# Patient Record
Sex: Female | Born: 1935 | Race: White | Hispanic: No | Smoking: Former smoker
Health system: Southern US, Community
[De-identification: ages and names within clinical notes are randomized; demographics above are authoritative.]

## PROBLEM LIST (undated history)

## (undated) DIAGNOSIS — C679 Malignant neoplasm of bladder, unspecified: Secondary | ICD-10-CM

## (undated) DIAGNOSIS — L89159 Pressure ulcer of sacral region, unspecified stage: Secondary | ICD-10-CM

## (undated) DIAGNOSIS — I10 Essential (primary) hypertension: Secondary | ICD-10-CM

## (undated) DIAGNOSIS — I714 Abdominal aortic aneurysm, without rupture: Secondary | ICD-10-CM

## (undated) DIAGNOSIS — N939 Abnormal uterine and vaginal bleeding, unspecified: Secondary | ICD-10-CM

## (undated) DIAGNOSIS — Z720 Tobacco use: Secondary | ICD-10-CM

## (undated) DIAGNOSIS — I639 Cerebral infarction, unspecified: Secondary | ICD-10-CM

## (undated) DIAGNOSIS — G459 Transient cerebral ischemic attack, unspecified: Secondary | ICD-10-CM

## (undated) DIAGNOSIS — M51369 Other intervertebral disc degeneration, lumbar region without mention of lumbar back pain or lower extremity pain: Secondary | ICD-10-CM

## (undated) DIAGNOSIS — R918 Other nonspecific abnormal finding of lung field: Secondary | ICD-10-CM

## (undated) DIAGNOSIS — H9190 Unspecified hearing loss, unspecified ear: Secondary | ICD-10-CM

## (undated) DIAGNOSIS — I739 Peripheral vascular disease, unspecified: Secondary | ICD-10-CM

## (undated) DIAGNOSIS — F0390 Unspecified dementia without behavioral disturbance: Secondary | ICD-10-CM

## (undated) DIAGNOSIS — T4145XA Adverse effect of unspecified anesthetic, initial encounter: Secondary | ICD-10-CM

## (undated) DIAGNOSIS — C7989 Secondary malignant neoplasm of other specified sites: Secondary | ICD-10-CM

## (undated) DIAGNOSIS — H409 Unspecified glaucoma: Secondary | ICD-10-CM

## (undated) DIAGNOSIS — J302 Other seasonal allergic rhinitis: Secondary | ICD-10-CM

## (undated) DIAGNOSIS — H3530 Unspecified macular degeneration: Secondary | ICD-10-CM

## (undated) DIAGNOSIS — E039 Hypothyroidism, unspecified: Secondary | ICD-10-CM

## (undated) DIAGNOSIS — I499 Cardiac arrhythmia, unspecified: Secondary | ICD-10-CM

## (undated) DIAGNOSIS — E079 Disorder of thyroid, unspecified: Secondary | ICD-10-CM

## (undated) DIAGNOSIS — J449 Chronic obstructive pulmonary disease, unspecified: Secondary | ICD-10-CM

## (undated) DIAGNOSIS — M5136 Other intervertebral disc degeneration, lumbar region: Secondary | ICD-10-CM

## (undated) DIAGNOSIS — G629 Polyneuropathy, unspecified: Secondary | ICD-10-CM

## (undated) DIAGNOSIS — Z9221 Personal history of antineoplastic chemotherapy: Secondary | ICD-10-CM

## (undated) DIAGNOSIS — I2510 Atherosclerotic heart disease of native coronary artery without angina pectoris: Secondary | ICD-10-CM

## (undated) HISTORY — DX: Other intervertebral disc degeneration, lumbar region: M51.36

## (undated) HISTORY — DX: Unspecified hearing loss, unspecified ear: H91.90

## (undated) HISTORY — PX: BLADDER SURGERY: SHX569

## (undated) HISTORY — DX: Unspecified glaucoma: H40.9

## (undated) HISTORY — DX: Atherosclerotic heart disease of native coronary artery without angina pectoris: I25.10

## (undated) HISTORY — DX: Abdominal aortic aneurysm, without rupture: I71.4

## (undated) HISTORY — PX: APPENDECTOMY: SHX54

## (undated) HISTORY — DX: Cerebral infarction, unspecified: I63.9

## (undated) HISTORY — DX: Transient cerebral ischemic attack, unspecified: G45.9

## (undated) HISTORY — DX: Other nonspecific abnormal finding of lung field: R91.8

## (undated) HISTORY — DX: Abnormal uterine and vaginal bleeding, unspecified: N93.9

## (undated) HISTORY — DX: Polyneuropathy, unspecified: G62.9

## (undated) HISTORY — DX: Secondary malignant neoplasm of other specified sites: C67.9

## (undated) HISTORY — DX: Essential (primary) hypertension: I10

## (undated) HISTORY — PX: BACK SURGERY: SHX140

## (undated) HISTORY — PX: URETERAL STENT PLACEMENT: SHX822

## (undated) HISTORY — DX: Unspecified macular degeneration: H35.30

## (undated) HISTORY — DX: Personal history of antineoplastic chemotherapy: Z92.21

## (undated) HISTORY — DX: Other seasonal allergic rhinitis: J30.2

## (undated) HISTORY — DX: Unspecified dementia, unspecified severity, without behavioral disturbance, psychotic disturbance, mood disturbance, and anxiety: F03.90

## (undated) HISTORY — DX: Pressure ulcer of sacral region, unspecified stage: L89.159

## (undated) HISTORY — DX: Tobacco use: Z72.0

## (undated) HISTORY — PX: CATARACT EXTRACTION: SUR2

## (undated) HISTORY — DX: Cardiac arrhythmia, unspecified: I49.9

## (undated) HISTORY — PX: TONSILLECTOMY: SUR1361

## (undated) HISTORY — DX: Hypothyroidism, unspecified: E03.9

## (undated) HISTORY — PX: ABDOMINAL HYSTERECTOMY: SHX81

## (undated) HISTORY — DX: Peripheral vascular disease, unspecified: I73.9

## (undated) HISTORY — DX: Other intervertebral disc degeneration, lumbar region without mention of lumbar back pain or lower extremity pain: M51.369

## (undated) HISTORY — PX: SKIN BIOPSY: SHX1

## (undated) HISTORY — PX: COLONOSCOPY: SHX174

## (undated) HISTORY — PX: OTHER SURGICAL HISTORY: SHX169

## (undated) HISTORY — DX: Adverse effect of unspecified anesthetic, initial encounter: T41.45XA

## (undated) HISTORY — DX: Chronic obstructive pulmonary disease, unspecified: J44.9

## (undated) HISTORY — DX: Disorder of thyroid, unspecified: E07.9

## (undated) HISTORY — DX: Malignant neoplasm of bladder, unspecified: C67.9

## (undated) HISTORY — DX: Secondary malignant neoplasm of other specified sites: C79.89

---
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
## 2007-03-04 ENCOUNTER — Ambulatory Visit (HOSPITAL_COMMUNITY): Admission: RE | Admit: 2007-03-04 | Discharge: 2007-03-05 | Payer: Self-pay | Admitting: Cardiology

## 2007-03-04 DIAGNOSIS — I714 Abdominal aortic aneurysm, without rupture, unspecified: Secondary | ICD-10-CM

## 2007-03-04 DIAGNOSIS — I2510 Atherosclerotic heart disease of native coronary artery without angina pectoris: Secondary | ICD-10-CM

## 2007-03-04 HISTORY — DX: Abdominal aortic aneurysm, without rupture, unspecified: I71.40

## 2007-03-04 HISTORY — PX: CARDIAC CATHETERIZATION: SHX172

## 2007-03-04 HISTORY — DX: Atherosclerotic heart disease of native coronary artery without angina pectoris: I25.10

## 2007-03-04 HISTORY — DX: Abdominal aortic aneurysm, without rupture: I71.4

## 2011-02-27 NOTE — Cardiovascular Report (Signed)
NAMEHARLIE, BUENING NO.:  [id number]   MEDICAL RECORD NO.:  [id number]          PATIENT TYPE:  OIB   LOCATION:  2807                         FACILITY:  [facility]   PHYSICIAN:  Thereasa Solo. Little, M.D. DATE OF BIRTH:  10/24/1925   DATE OF PROCEDURE:  03/04/2007  DATE OF DISCHARGE:                            CARDIAC CATHETERIZATION   INDICATIONS FOR TEST:  This 77 year old female with hypertension and  hyperlipidemia and COPD was referred by Dr. Shelva Majestic for cardiac  catheterization.  She has had increasing shortness of breath with mild  activity.  To [place], she denies any episodes of chest pain or pressure.  She does have occasional palpitations.   After obtaining informed consent, the patient was prepped and draped in  the usual sterile manner __________  5-French introducer sheath was  placed into the right femoral artery.  Left and right coronary  arteriography, ventriculography and a distal aortogram were performed.   COMPLICATIONS:  None.   EQUIPMENT:  5-French Judkins configuration catheter.   TOTAL CONTRAST USED:  100 mL.   MEDICATIONS:  20 mg of IV labetalol was given at the end of procedure  for blood pressure lowering.   RESULTS:  1. Hemodynamic monitoring:  Central aortic pressure was 194/75.  Left      ventricular pressure was 190/12.  2. Ventriculography.  Ventriculography in the RAO projection using 20      mL of contrast at 12 mL per second revealed mild left ventricular      hypertrophy with mild global hypokinesis and ejection fraction      around 45-50%.  The left ventricular pressure was 19.   Distal aortogram.  The distal aortogram done at the level of the renal  arteries using 30 mL of contrasted at 15 mL per second revealed no  evidence of any renal artery stenosis.  There is a small fusiform  infrarenal abdominal aortic aneurysm that extends down to the  bifurcation of the aorta.   Coronary arteriography:  Calcification was seen in the  distribution of  the proximal LAD.  1. Left main normal.  It bifurcated.  2. Circumflex.  The circumflex had minimal irregularities in the      proximal portion.  There was a small first OM vessel that was free      of disease and a large second OM vessel that had a few branches      coming off that was also free of disease.  3. LAD.  The LAD extended down across the apex of the heart.  There      was minimal irregularities within the LAD itself.  This was a very      large vessel about 3.5 mm in diameter.  There was a moderate 2.5-mm      first diagonal.  There was an 80% area of ostial narrowing of this      diagonal.  4. Right coronary artery.  The right coronary was a smaller vessel      than the other two.  There was mild 30-40% narrowing in both the  mid and distal portion of the vessel.  The PDA and posterior      lateral vessels were free of disease.   CONCLUSION:  1. No evidence of renal artery stenosis.  2. Small fusiform infrarenal abdominal aortic aneurysm.  3. Left ventricular hypertrophy.  4. Mild global hypokinesia with ejection fraction of around 45-50%.  5. High-grade stenosis in the ostium of the first diagonal with mild      disease in the other vessels.   DISCUSSION:  Attempts at intervening upon the diagonal because of its  ostial location would be considered high risk.  My short term plan is  aggressive management of her blood pressure.  Unfortunately, she has had  problems with noncompliance in the past.  I switched her Micardis to  Exforge 5/320 and left her on Bystolic 5 mg.  In addition to this, she  is on aspirin and has been on statin-lowering drugs in the past.  I will  talk with Arnette Felts about restarting these.   If she were to develop worsening chest discomfort, would consider  cutting balloon to the ostium, but this would clearly be high risk.  A  lot of her symptoms may be related to her hypertension.            ______________________________  Thereasa Solo Little, M.D.     ABL/MEDQ  D:  03/04/2007  T:  03/04/2007  Job:  [id number]   cc:   Macarthur Critchley. Shelva Majestic, M.D.  Roxanne Mins, PA-C  Catheterization Laboratory  Thereasa Solo. Little, M.D.

## 2011-02-27 NOTE — Discharge Summary (Signed)
Annette Annette [name], Annette [name] NO.:  [id number]   MEDICAL RECORD NO.:  [id number]          PATIENT TYPE:  OIB   LOCATION:  3729                         FACILITY:  [facility]   PHYSICIAN:  Vonna Kotyk R. Jacinto Halim, MD       DATE OF BIRTH:  1926-06-23   DATE OF ADMISSION:  03/04/2007  DATE OF DISCHARGE:  03/05/2007                               DISCHARGE SUMMARY   DISCHARGE DIAGNOSES:  1. Coronary artery disease, status post catheterization on date of      admission by Dr. Clarene Duke - recommendations to continue aggressive      medical therapy.  2. Hypertension.  3. Hyperlipidemia.  4. Chronic obstructive pulmonary disease.  5. Hypothyroidism.  6. Ongoing tobacco use.  7. Abnormal liver enzymes.  8. ALLERGY TO THE IV CONTRAST MEDIA.  9. Urinary tract infection.  10.Medical noncompliance.   HPI/HOSPITAL COURSE:  This is a 77 year old Caucasian female, patient of  Dr. Shelva Majestic in [facility], who presented to [facility] for  coronary angiography to further evaluate her complaints of dyspnea and  chest pain.   Mainly, patient had been complaining of exertional dyspnea, which was  relieved with 10 or 15 minutes of rest and not much chest pain, just  occasional tightness and the dyspnea was progressive.  She also  complained of palpitations with lying flat, but no orthopnea or PND.   HOSPITAL PROCEDURES:  Cardiac catheterization performed by Dr. Clarene Duke on  Mar 04, 2007 and it showed 80% narrowing of the ostium of the first  diagonal branch.  Otherwise, LAD was free of disease.  Circumflex was  free of disease.  RCA was a small vessel that had 30-40% narrowing in  the mid and distal portions.  PDA and PLA were free of disease.  There  was no evidence of renal artery stenosis.  Small infrarenal abdominal  aortic aneurysm was noted.  Ejection fraction was 45-50% and also there  was left ventricular hypertrophy noted during the study.  The heart rate  did not reveal the ostium of first  diagonal branch.  Would consider to  risk it to intervene and the decision was made to continue aggressive  medical therapy, mainly to control patient's blood pressure and to add  beta-blockers to her medical regimen.   HOSPITAL LABORATORIES:  Liver enzymes were normal during this admission.  AST 24, ALT 18.  Creatinine 1.10 prior to cath and BUN 14.  Postcath,  creatinine was 1.13 and BUN 19, sodium 135, potassium 3.7, chloride 105,  CO2 25, glucose 135.  CBC showed hemoglobin 13.4, hematocrit 39.1, white  blood cell count 15.7, platelet count 221.  Her urinalysis, during this  admission, revealed many white blood cells and squamous epithelial cells  and some bacteria, so she was given Septra prophylactically prior to  cath.   The next day, postcatheterization, patient was considered to be stable  for discharge after she ambulates in the ward.   Patient ambulated without any difficulties.  She did not complain of any  chest pain and did not have any dyspnea on exertion.  We are going to  discharge patient home on the following medications:  1. Exforge 5/320 mg daily.  2. Aspirin 81 mg daily.  3. Bystolic 5 mg daily.  4. Imdur 30 mg daily.  5. Toprol-XL 25 mg daily.  6. Armour Thyroid 60 mg q.a.m. and 30 mg q.p.m.  7. Lipitor 40 mg daily.   DISCHARGE INSTRUCTIONS:  Patient is to stop Micardis and Benicar.  She  is not to drive or lift any weight greater than 5 pounds for 3 days  postcath.  She was advised to increase activity slowly and she is  allowed to take a shower, but no bathing.  Patient is to stay on a low-  fat, low-cholesterol diet.   DISCHARGE ACTIVITIES:  Patient is to see Dr. Shelva Majestic in 2-3 weeks and  she was encouraged to call his office and make an appointment.      Raymon Mutton, P.A.      [name], MD  Electronically Signed    MK/MEDQ  D:  03/05/2007  T:  03/05/2007  Job:  [id number]   cc:   Thereasa Solo. Little, M.D.  Macarthur Critchley Shelva Majestic, M.D.

## 2013-07-15 DIAGNOSIS — T8859XA Other complications of anesthesia, initial encounter: Secondary | ICD-10-CM

## 2013-07-15 HISTORY — DX: Other complications of anesthesia, initial encounter: T88.59XA

## 2013-10-01 DIAGNOSIS — R918 Other nonspecific abnormal finding of lung field: Secondary | ICD-10-CM | POA: Insufficient documentation

## 2013-10-01 HISTORY — DX: Other nonspecific abnormal finding of lung field: R91.8

## 2013-10-12 DIAGNOSIS — E039 Hypothyroidism, unspecified: Secondary | ICD-10-CM

## 2013-10-12 HISTORY — DX: Hypothyroidism, unspecified: E03.9

## 2013-11-06 HISTORY — PX: CYSTOSCOPY WITH RETROGRADE PYELOGRAM, URETEROSCOPY AND STENT PLACEMENT: SHX5789

## 2013-11-06 HISTORY — PX: VAGINAL MASS EXCISION: SHX2640

## 2013-11-24 DIAGNOSIS — F17200 Nicotine dependence, unspecified, uncomplicated: Secondary | ICD-10-CM | POA: Insufficient documentation

## 2014-03-18 ENCOUNTER — Ambulatory Visit: Payer: PRIVATE HEALTH INSURANCE | Attending: Pediatrics | Admitting: Occupational Therapy

## 2014-03-18 DIAGNOSIS — H402290 Chronic angle-closure glaucoma, unspecified eye, stage unspecified: Secondary | ICD-10-CM | POA: Diagnosis not present

## 2014-03-18 DIAGNOSIS — H3530 Unspecified macular degeneration: Secondary | ICD-10-CM | POA: Diagnosis not present

## 2014-03-18 DIAGNOSIS — H409 Unspecified glaucoma: Secondary | ICD-10-CM | POA: Diagnosis not present

## 2014-03-18 DIAGNOSIS — H5410 Blindness, one eye, low vision other eye, unspecified eyes: Secondary | ICD-10-CM | POA: Insufficient documentation

## 2014-03-18 DIAGNOSIS — IMO0001 Reserved for inherently not codable concepts without codable children: Secondary | ICD-10-CM | POA: Diagnosis present

## 2014-03-18 DIAGNOSIS — H53419 Scotoma involving central area, unspecified eye: Secondary | ICD-10-CM | POA: Insufficient documentation

## 2014-04-23 ENCOUNTER — Non-Acute Institutional Stay (SKILLED_NURSING_FACILITY): Payer: Medicare Other | Admitting: Internal Medicine

## 2014-04-23 DIAGNOSIS — I10 Essential (primary) hypertension: Secondary | ICD-10-CM

## 2014-04-23 DIAGNOSIS — C7989 Secondary malignant neoplasm of other specified sites: Secondary | ICD-10-CM

## 2014-04-23 DIAGNOSIS — F17210 Nicotine dependence, cigarettes, uncomplicated: Secondary | ICD-10-CM

## 2014-04-23 DIAGNOSIS — C679 Malignant neoplasm of bladder, unspecified: Secondary | ICD-10-CM

## 2014-04-23 DIAGNOSIS — F17200 Nicotine dependence, unspecified, uncomplicated: Secondary | ICD-10-CM

## 2014-04-23 DIAGNOSIS — I160 Hypertensive urgency: Secondary | ICD-10-CM

## 2014-04-23 DIAGNOSIS — C779 Secondary and unspecified malignant neoplasm of lymph node, unspecified: Secondary | ICD-10-CM

## 2014-04-23 DIAGNOSIS — G9341 Metabolic encephalopathy: Secondary | ICD-10-CM

## 2014-04-23 DIAGNOSIS — C50919 Malignant neoplasm of unspecified site of unspecified female breast: Secondary | ICD-10-CM

## 2014-04-24 ENCOUNTER — Encounter: Payer: Self-pay | Admitting: Internal Medicine

## 2014-04-24 DIAGNOSIS — M5136 Other intervertebral disc degeneration, lumbar region: Secondary | ICD-10-CM | POA: Insufficient documentation

## 2014-04-24 DIAGNOSIS — J449 Chronic obstructive pulmonary disease, unspecified: Secondary | ICD-10-CM | POA: Insufficient documentation

## 2014-04-24 DIAGNOSIS — I714 Abdominal aortic aneurysm, without rupture, unspecified: Secondary | ICD-10-CM | POA: Insufficient documentation

## 2014-04-24 DIAGNOSIS — G9341 Metabolic encephalopathy: Secondary | ICD-10-CM | POA: Insufficient documentation

## 2014-04-24 DIAGNOSIS — E039 Hypothyroidism, unspecified: Secondary | ICD-10-CM | POA: Insufficient documentation

## 2014-04-24 DIAGNOSIS — I160 Hypertensive urgency: Secondary | ICD-10-CM | POA: Insufficient documentation

## 2014-04-24 DIAGNOSIS — F0390 Unspecified dementia without behavioral disturbance: Secondary | ICD-10-CM | POA: Insufficient documentation

## 2014-04-24 DIAGNOSIS — I10 Essential (primary) hypertension: Secondary | ICD-10-CM | POA: Insufficient documentation

## 2014-04-24 DIAGNOSIS — C679 Malignant neoplasm of bladder, unspecified: Secondary | ICD-10-CM | POA: Insufficient documentation

## 2014-04-24 DIAGNOSIS — C7989 Secondary malignant neoplasm of other specified sites: Secondary | ICD-10-CM | POA: Insufficient documentation

## 2014-04-24 NOTE — Assessment & Plan Note (Signed)
Treatment with BCG instillation and stent to R ureter 2/2 obstruction which was to e removed when pt's BP shot up and required hospitalization

## 2014-04-24 NOTE — Assessment & Plan Note (Signed)
Presenting with MS change;family reported concern for her being hypotensive causing HP MDs to wonder if hx HTN but per [facility] pt with HTN in at least 10/2013;treated and resolved

## 2014-04-24 NOTE — Assessment & Plan Note (Signed)
Present

## 2014-04-24 NOTE — Progress Notes (Signed)
MRN: [id number] Name: Annette [name]  Sex: female Age: 80 y.o. DOB: [birth year]  [place] #: [facility] Facility/Room: 112 Level Of Care: SNF Provider: Inocencio Homes D Emergency Contacts: Extended Emergency Contact Information Primary Emergency Contact: Reeves Phone: [phone] Relation: None  Code Status: FULL - needs to be DNR as she would be unlikely to survive CPR without severe complications  Allergies: Beta adrenergic blockers; Ciprofloxacin; Combigan; Iodine; Sulfa antibiotics; and Tetracyclines & related  Chief Complaint  Patient presents with  . nursing home admission    HPI: Patient is 80 y.o. female who was hospitalized for acute encephalopathy and HTN urgency who comes from home, then hosp then SNF with bladder CA with obstruction and stent.  Past Medical History  Diagnosis Date  . Dementia without behavioral disturbance   . Bladder cancer metastasized to pelvic region   . COPD (chronic obstructive pulmonary disease)   . Hypothyroid   . Hypertension     Past Surgical History  Procedure Laterality Date  . Ureteral stent placement Right     2/2 bladder CA  . Vaginal tumor      removed in remote past  . Eye surgery      cataract      Medication List       This list is accurate as of: 04/23/14 11:59 PM.  Always use your most recent med list.               amLODipine 10 MG tablet  Commonly known as:  NORVASC  Take 10 mg by mouth daily.     bimatoprost 0.01 % Soln  Commonly known as:  LUMIGAN  Place 1 drop into both eyes at bedtime.     budesonide 180 MCG/ACT inhaler  Commonly known as:  PULMICORT  Inhale 1 puff into the lungs 2 (two) times daily.     donepezil 5 MG tablet  Commonly known as:  ARICEPT  Take 5 mg by mouth at bedtime.     levothyroxine 25 MCG tablet  Commonly known as:  SYNTHROID, LEVOTHROID  Take 25 mcg by mouth daily before breakfast.     lisinopril 20 MG tablet  Commonly known as:  PRINIVIL,ZESTRIL  Take 20 mg by  mouth daily.     MULTIVITAMIN & MINERAL PO  Take 1 tablet by mouth daily.     psyllium 58.6 % powder  Commonly known as:  METAMUCIL  Take 1 packet by mouth daily as needed.        Meds ordered this encounter  Medications  . bimatoprost (LUMIGAN) 0.01 % SOLN    Sig: Place 1 drop into both eyes at bedtime.  . budesonide (PULMICORT) 180 MCG/ACT inhaler    Sig: Inhale 1 puff into the lungs 2 (two) times daily.  . Multiple Vitamins-Minerals (MULTIVITAMIN & MINERAL PO)    Sig: Take 1 tablet by mouth daily.  . psyllium (METAMUCIL) 58.6 % powder    Sig: Take 1 packet by mouth daily as needed.  Marland Kitchen amLODipine (NORVASC) 10 MG tablet    Sig: Take 10 mg by mouth daily.  Marland Kitchen donepezil (ARICEPT) 5 MG tablet    Sig: Take 5 mg by mouth at bedtime.  Marland Kitchen levothyroxine (SYNTHROID, LEVOTHROID) 25 MCG tablet    Sig: Take 25 mcg by mouth daily before breakfast.  . lisinopril (PRINIVIL,ZESTRIL) 20 MG tablet    Sig: Take 20 mg by mouth daily.     There is no immunization history on file for this patient.  History  Substance Use Topics  . Smoking status: Current Every Day Smoker  . Smokeless tobacco: Never Used  . Alcohol Use: No    Family history is noncontributory    Review of Systems   Family member gives basically neg ROS   Filed Vitals:   04/23/14 1142  BP: 117/65  Pulse: 67  Temp: 97.9 F (36.6 C)  Resp: 18    Physical Exam  GENERAL APPEARANCE: Alert,min conversant. Appropriately groomed. No acute distress.;extremely frail  SKIN: No diaphoresis rash HEAD: Normocephalic, atraumatic  EYES: Conjunctiva/lids clear. Pupils round, reactive. EOMs intact.  EARS: External exam WNL, canals clear. Hearing grossly normal.  NOSE: No deformity or discharge.  MOUTH/THROAT: Lips w/o lesions. RESPIRATORY: Breathing is even, unlabored. Lung sounds are clear   CARDIOVASCULAR: Heart RRR 3/6 murmur rubs or gallops. No peripheral edema.   GASTROINTESTINAL: Abdomen is soft, non-tender, not  distended w/ normal bowel sounds. GENITOURINARY: Bladder non tender, not distended  MUSCULOSKELETAL: No abnormal joints or musculature NEUROLOGIC:  Cranial nerves 2-12 grossly intact. Moves all extremities PSYCHIATRIC: dementia, no behavioral issues  Patient Active Problem List   Diagnosis Date Noted  . AAA (abdominal aortic aneurysm) 04/24/2014  . DDD (degenerative disc disease), lumbar 04/24/2014  . Encephalopathy, metabolic 75/44/9201  . Hypertensive urgency 04/24/2014  . Dementia without behavioral disturbance   . Bladder cancer metastasized to pelvic region   . COPD (chronic obstructive pulmonary disease)   . Hypothyroid   . Hypertension   . Nicotine addiction 11/24/2013  . Lung nodule, multiple 10/01/2013    CBC   WBC 8.6  13.4/   PLT 157   CMP Na 137, 3.7  21/0.75 , Ca 9.7, alb 3.4   Assessment and Plan  Encephalopathy, metabolic Cause unknown, possibly hypertensive uegency  Hypertensive urgency Presenting with MS change;family reported concern for her being hypotensive causing HP MDs to wonder if hx HTN but per [facility] pt with HTN in at least 10/2013;treated and resolved  Bladder cancer metastasized to pelvic region Treatment with BCG instillation and stent to R ureter 2/2 obstruction which was to e removed when pt's BP shot up and required hospitalization  Nicotine addiction Present    Hennie Duos, MD

## 2014-04-24 NOTE — Assessment & Plan Note (Signed)
Cause unknown, possibly hypertensive uegency

## 2014-04-26 ENCOUNTER — Non-Acute Institutional Stay (SKILLED_NURSING_FACILITY): Payer: Medicare Other | Admitting: Internal Medicine

## 2014-04-26 ENCOUNTER — Encounter: Payer: Self-pay | Admitting: Internal Medicine

## 2014-04-26 DIAGNOSIS — I10 Essential (primary) hypertension: Secondary | ICD-10-CM

## 2014-04-26 DIAGNOSIS — R609 Edema, unspecified: Secondary | ICD-10-CM

## 2014-04-26 NOTE — Progress Notes (Signed)
Patient ID: Annette [name], female   DOB: May 06, 1926, 80 y.o.   MRN: [id number]   this is an acute visit.  Level of care skilled.  Facility AF.   Chief Complaint   Patient presents with   .   acute visit secondary to lower extremity edema   HPI: Patient is 80 y.o. female who was hospitalized for acute encephalopathy and HTN urgency who comes from home, then hosp then SNF with bladder CA with obstruction and stent--.  Her stay here so far as been relatively uneventful however nursing staff has noted some increased lower extremity edema--mainly in her feet.  She does have a history of hypertensive urgency-she is on Norvasc 10 mg a day as well as lisinopril 20 mg a day-she does not report any shortness of breath chest pain or leg pain.   Past Medical History   Diagnosis  Date   .  Dementia without behavioral disturbance    .  Bladder cancer metastasized to pelvic region    .  COPD (chronic obstructive pulmonary disease)    .  Hypothyroid    .  Hypertension     Past Surgical History   Procedure  Laterality  Date   .  Ureteral stent placement  Right      2/2 bladder CA   .  Vaginal tumor       removed in remote past   .  Eye surgery       cataract      Medication List                     amLODipine 10 MG tablet    Commonly known as: NORVASC    Take 10 mg by mouth daily.    bimatoprost 0.01 % Soln    Commonly known as: LUMIGAN    Place 1 drop into both eyes at bedtime.    budesonide 180 MCG/ACT inhaler    Commonly known as: PULMICORT    Inhale 1 puff into the lungs 2 (two) times daily.    donepezil 5 MG tablet    Commonly known as: ARICEPT    Take 5 mg by mouth at bedtime.    levothyroxine 25 MCG tablet    Commonly known as: SYNTHROID, LEVOTHROID    Take 25 mcg by mouth daily before breakfast.    lisinopril 20 MG tablet    Commonly known as: PRINIVIL,ZESTRIL    Take 20 mg by mouth daily.    MULTIVITAMIN & MINERAL PO    Take 1 tablet by mouth daily.    psyllium  58.6 % powder    Commonly known as: METAMUCIL    Take 1 packet by mouth daily as needed.                                                                   There is no immunization history on file for this patient.  History   Substance Use Topics   .  Smoking status:  Current Every Day Smoker   .  Smokeless tobacco:  Never Used   .  Alcohol Use:  No   Family history is noncontributory  Review of Systems    Gen. no complaints of fever or chills.  Respiratory  does not complain of shortness of breath or cough.  Cardiac no chest pain does have some edema mainly in her feet bilaterally.  GI-no complaint of abdominal pain nausea vomiting diarrhea or constipation  Muscle skeletal does not complaining of joint pain or leg pain.  Neurologic is not complaining of dizziness or headache    Physical Exam  Temperature 98.7 pulse 58 respirations 20 blood pressure 114/60-130/62-most recently  GENERAL APPEARANCE: Alert,min conversant. Appropriately groomed. No acute distress.;extremely frail  SKIN: No diaphoresis rash has some skin tears on her left upper arm--slight abrasions right elbow and left shin HEAD: Normocephalic, atraumatic  EYES: Conjunctiva/lids clear. Pupils round, reactive. EOMs intact.  l.  MOUTH/THROAT: Oropharynx clear mucous membranes moist  RESPIRATORY: Breathing is even, unlabored. Lung sounds are clear  CARDIOVASCULAR: Heart RRR 3/6 murmur rubs or gallops. 1+ edema of her feet  bilat mainly trace edema of her legs.  GASTROINTESTINAL: Abdomen is soft, non-tender, not distended w/ normal bowel sounds.   MUSCULOSKELETAL: No abnormal joints or musculature moves all extremities x4 NEUROLOGIC: Cranial nerves 2-12 grossly intact. Moves all extremities  PSYCHIATRIC: dementia, no behavioral issues  Patient Active Problem List    Diagnosis  Date Noted   .  AAA (abdominal aortic aneurysm)  04/24/2014   .  DDD (degenerative disc disease), lumbar  04/24/2014     .  Encephalopathy, metabolic  41/66/0630   .  Hypertensive urgency  04/24/2014   .  Dementia without behavioral disturbance    .  Bladder cancer metastasized to pelvic region    .  COPD (chronic obstructive pulmonary disease)    .  Hypothyroid    .  Hypertension    .  Nicotine addiction  11/24/2013   .  Lung nodule, multiple  10/01/2013   CBC WBC 8.6 13.4/ PLT 157  CMP Na 137, 3.7 21/0.75 , Ca 9.7, alb 3.4   Hospital labs.  WBC 12.2 hemoglobin 12.7 platelets 151.  Sodium 138 potassium 3.6 BUN 21 creatinine 0.84-liver function tests within normal limits except total bilirubin of 0.7-TSH at 0.133-T4 0.79--T3-53.0.   Assessment and Plan   Edema-clinically she appears stable will write order to weigh patient will notify provider gain greater than 3 pounds-also encouraged leg elevation-update a BNP tomorrow notify provider--also weights  3 times a week Encephalopathy, metabolic  Cause unknown, possibly hypertensive urgency but this has resolved  Hypertensive urgency  Presenting with MS change;family reported concern for her being hypotensive causing HP MDs to wonder if hx HTN but per [facility] pt with HTN in at least 10/2013;treated and resolved--recent blood pressures appear satisfactory 130/62-114/60-126/65  Bladder cancer metastasized to pelvic region  Treatment with BCG instillation and stent to R ureter 2/2 obstruction which was to e removed when pt's BP shot up and required hospitalization  Of note also will update CBC basic metabolic panel and TSH tomorrow-I do not somewhat low TSH T4 and T3 previously  [id number]  Nicotine addiction  Present

## 2014-05-02 DIAGNOSIS — I10 Essential (primary) hypertension: Secondary | ICD-10-CM | POA: Insufficient documentation

## 2014-05-02 DIAGNOSIS — R609 Edema, unspecified: Secondary | ICD-10-CM | POA: Insufficient documentation

## 2014-05-03 ENCOUNTER — Non-Acute Institutional Stay (SKILLED_NURSING_FACILITY): Payer: Medicare Other | Admitting: Internal Medicine

## 2014-05-03 ENCOUNTER — Encounter: Payer: Self-pay | Admitting: Internal Medicine

## 2014-05-03 DIAGNOSIS — R609 Edema, unspecified: Secondary | ICD-10-CM

## 2014-05-03 NOTE — Progress Notes (Signed)
Patient ID: Annette [name], female   DOB: 1926/08/16, 80 y.o.   MRN: [id number]   This is an acute visit.  Level of care skilled.  Facility AF.   [place]  complaint-acute visit followup edema.  History of present illness.  Patient is an 80 year old female recently hospitalized for acute encephalopathy and hypertensive urgency-she has a history of bladder cancer with obstruction and stent as well.  She is here for strengthening after hospitalization for hypertensive urgency and encephalopathy that was thought possibly caused by the hypertension.  She is on lisinopril as well as Norvasc-blood pressure appears to be quite well controlled recent readings 118/58-129/62-116/61.  She was seen last week for some lower extremity edema mainly in her feet last labs were ordered also leg elevation was encouraged.  Labs have come back fairly unremarkable BNP is minimally elevated at 150.7.  TSH was within normal range at 4.2-electrolytes and hemoglobin was stable as well.  However patient continues to have edema and apparently has some edema of her right arm as well now  She continues to deny any shortness of breath or chest pain-her legs were not elevated when I entered the room however apparently she states she does have an elevated most of the time.  Family medical social history as been reviewed per admission note on 04/23/2014.  Medications have been reviewed per MAR.  Review of systems.  In general no complaints of fever or chills.  Respiratory denies any shortness of breath or cough.  Cardiac no chest pain as some increased lower extremity edema bilaterally has also some edema more shoulder right hand does not complaining of any pain with this.  GI does not complaining of any nausea vomiting diarrhea constipation or abdominal pain.  Muscle skeletal is not complaining of any leg or joint pain at present.  Neurologic does not complaining of headache dizziness or numbness.  Physical  exam.  Temperature is 97.3 pulse is 62 respirations 16 blood pressure 118/58.  In general this is a somewhat frail elderly female in no distress sitting comfortably in her chair.  Her skin is warm and dry.  Chest is clear to auscultation no labored breathing.  Heart is regular rate and rhythm with a 3/6 systolic murmur-I would say she has 1- 2+ lower extremity edema this is more of her shin and foot area -- she also has some edema of her right hand all this edema is cool to touch.  Radial pulse is intact.  Neurologic appears grossly intact cranial nerves intact her speech is clear.  Psych she does have some history of dementia but was appropriate when I ask her questions.  Labs.  04/27/2014.  WBC 9.3 hemoglobin 12.2 platelets 290.  Sodium 134 potassium 4.2 BUN 20 creatinine 0.73.  BNP-150.7.  I note albumin was 3.4 in the hospital  Assessment and plan.  #1-edema-this appears to be increasing I do note she is on Norvasc which can have edema side effects however her blood pressure has been under what appears to be quite satisfactory control-this was discussed with Dr. [name] last week and decision was made to monitor this- since her blood pressures were doing so well  --I discussed this with Dr. [name] again today-at this point hesitant to discontinue the Norvasc since it appears to be effective-Will start hydrochlorothiazide 25 mg a day with 10 mEq of potassium supplementation-Will have to monitor her metabolic panel closely I note her sodium is 134 which is borderline  we'll have to make sure this is  stable-I suspect she will benefit from the diuresis however.  Again this will be a balancing act.    .  Will update a CMP to see where her albumin stands--  again BNP was minimally elevated she does not show signs of any shortness of breath or chest pain   In regards to right hand edema this does not appear to be acute she states she does sleep on that side which could  contribute to that-- will write order to encourage elevation and monitor for changes  Also will obtain venous Dopplers lower extremity ibilaterally rule out DVT although I suspect likelihood is quite low.  Continue to monitor weights apparently these have been stable have written orders to notify provider any gain greater than 3 pounds--  Also obtain blood pressures every shift to keep an eye on this  [phone]

## 2014-05-05 ENCOUNTER — Encounter: Payer: Self-pay | Admitting: Internal Medicine

## 2014-05-05 ENCOUNTER — Non-Acute Institutional Stay (SKILLED_NURSING_FACILITY): Payer: Medicare Other | Admitting: Internal Medicine

## 2014-05-05 DIAGNOSIS — E038 Other specified hypothyroidism: Secondary | ICD-10-CM

## 2014-05-05 DIAGNOSIS — J438 Other emphysema: Secondary | ICD-10-CM

## 2014-05-05 DIAGNOSIS — C7989 Secondary malignant neoplasm of other specified sites: Secondary | ICD-10-CM

## 2014-05-05 DIAGNOSIS — I10 Essential (primary) hypertension: Secondary | ICD-10-CM

## 2014-05-05 DIAGNOSIS — C679 Malignant neoplasm of bladder, unspecified: Secondary | ICD-10-CM

## 2014-05-05 DIAGNOSIS — R918 Other nonspecific abnormal finding of lung field: Secondary | ICD-10-CM

## 2014-05-05 DIAGNOSIS — E034 Atrophy of thyroid (acquired): Secondary | ICD-10-CM

## 2014-05-05 DIAGNOSIS — F17210 Nicotine dependence, cigarettes, uncomplicated: Secondary | ICD-10-CM

## 2014-05-05 DIAGNOSIS — C779 Secondary and unspecified malignant neoplasm of lymph node, unspecified: Secondary | ICD-10-CM

## 2014-05-05 DIAGNOSIS — E0789 Other specified disorders of thyroid: Secondary | ICD-10-CM

## 2014-05-05 DIAGNOSIS — F17200 Nicotine dependence, unspecified, uncomplicated: Secondary | ICD-10-CM

## 2014-05-05 DIAGNOSIS — C50919 Malignant neoplasm of unspecified site of unspecified female breast: Secondary | ICD-10-CM

## 2014-05-05 DIAGNOSIS — J432 Centrilobular emphysema: Secondary | ICD-10-CM

## 2014-05-05 DIAGNOSIS — F0390 Unspecified dementia without behavioral disturbance: Secondary | ICD-10-CM

## 2014-05-05 NOTE — Progress Notes (Signed)
MRN: [id number] Name: Annette [name]  Sex: female Age: 80 y.o. DOB: 10-04-1926  [place] [facility]: [facility] Facility/Room: 112 Level Of Care: SNF Provider: Inocencio Homes D Emergency Contacts: Extended Emergency Contact Information Primary Emergency Contact: [facility] Address: [address] [place], [place] [address] [place] Phone: [phone] Mobile Phone: [phone] Relation: Son Secondary Emergency Contact: Sharene Butters Address: [address]          [place], [place] [address] [place] Phone: [phone] Mobile Phone: [phone] Relation: Relative  Code Status: FULL  Allergies: Beta adrenergic blockers; Ciprofloxacin; Combigan; Iodine; Sulfa antibiotics; and Tetracyclines & related  Chief Complaint  Patient presents with  . Discharge Note    HPI: Patient is 80 y.o. female who was admitted for OT/PT is ready to go home.  Past Medical History  Diagnosis Date  . Dementia without behavioral disturbance   . Bladder cancer metastasized to pelvic region   . COPD (chronic obstructive pulmonary disease)   . Hypothyroid   . Hypertension     Past Surgical History  Procedure Laterality Date  . Ureteral stent placement Right     2/2 bladder CA  . Vaginal tumor      removed in remote past  . Eye surgery      cataract      Medication List       This list is accurate as of: 05/05/14  5:45 PM.  Always use your most recent med list.               amLODipine 10 MG tablet  Commonly known as:  NORVASC  Take 10 mg by mouth daily.     bimatoprost 0.01 % Soln  Commonly known as:  LUMIGAN  Place 1 drop into both eyes at bedtime.     budesonide 180 MCG/ACT inhaler  Commonly known as:  PULMICORT  Inhale 1 puff into the lungs 2 (two) times daily.     donepezil 5 MG tablet  Commonly known as:  ARICEPT  Take 5 mg by mouth at bedtime.     hydrochlorothiazide 25 MG tablet  Commonly known as:  HYDRODIURIL  Take 25 mg by mouth  daily.     levothyroxine 25 MCG tablet  Commonly known as:  SYNTHROID, LEVOTHROID  Take 25 mcg by mouth daily before breakfast.     lisinopril 20 MG tablet  Commonly known as:  PRINIVIL,ZESTRIL  Take 20 mg by mouth daily.     MULTIVITAMIN & MINERAL PO  Take 1 tablet by mouth daily.     potassium chloride 10 MEQ tablet  Commonly known as:  K-DUR  Take 10 mEq by mouth daily.     psyllium 58.6 % powder  Commonly known as:  METAMUCIL  Take 1 packet by mouth daily as needed.        Meds ordered this encounter  Medications  . hydrochlorothiazide (HYDRODIURIL) 25 MG tablet    Sig: Take 25 mg by mouth daily.  . potassium chloride (K-DUR) 10 MEQ tablet    Sig: Take 10 mEq by mouth daily.     There is no immunization history on file for this patient.  History  Substance Use Topics  . Smoking status: Current Every Day Smoker  . Smokeless tobacco: Never Used  . Alcohol Use: No    Filed Vitals:   05/05/14 1630  BP: 120/63  Pulse: 92  Temp: 98.6 F (37 C)  Resp: 20    Physical Exam  GENERAL APPEARANCE: Alert, conversant. Appropriately groomed. No acute distress.  HEENT: Unremarkable. RESPIRATORY: Breathing is even, unlabored. Lung sounds are clear   CARDIOVASCULAR: Heart RRR no murmurs, rubs or gallops. No peripheral edema.  GASTROINTESTINAL: Abdomen is soft, non-tender, not distended w/ normal bowel sounds.  NEUROLOGIC: Cranial nerves 2-12 grossly intact. Moves all extremities no tremor.  Patient Active Problem List   Diagnosis Date Noted  . Edema 05/02/2014  . HTN (hypertension) 05/02/2014  . AAA (abdominal aortic aneurysm) 04/24/2014  . DDD (degenerative disc disease), lumbar 04/24/2014  . Encephalopathy, metabolic 08/08/8526  . Hypertensive urgency 04/24/2014  . Dementia without behavioral disturbance   . Bladder cancer metastasized to pelvic region   . COPD (chronic obstructive pulmonary disease)   . Hypothyroid   . Hypertension   . Nicotine addiction  11/24/2013  . Lung nodule, multiple 10/01/2013        Assessment and Plan  Patient is in improved and stable condition for discharge to home.  Hennie Duos, MD

## 2015-12-06 LAB — HEPATIC FUNCTION PANEL
ALT: 22 U/L (ref 7–35)
AST: 35 U/L (ref 13–35)
Alkaline Phosphatase: 91 U/L (ref 25–125)

## 2015-12-10 LAB — BASIC METABOLIC PANEL
BUN: 34 mg/dL — AB (ref 4–21)
CREATININE: 1 mg/dL (ref <=1.1)
Potassium: 4 mmol/L (ref 3.4–5.3)
Sodium: 145 mmol/L (ref 137–147)

## 2015-12-10 LAB — CBC AND DIFFERENTIAL
HEMATOCRIT: 34 % — AB (ref 36–46)
HEMOGLOBIN: 11.1 g/dL — AB (ref 12.0–16.0)
WBC: 8.3 10^3/mL

## 2015-12-14 ENCOUNTER — Encounter: Payer: Self-pay | Admitting: Internal Medicine

## 2015-12-14 ENCOUNTER — Non-Acute Institutional Stay (SKILLED_NURSING_FACILITY): Payer: Medicare Other | Admitting: Internal Medicine

## 2015-12-14 DIAGNOSIS — R911 Solitary pulmonary nodule: Secondary | ICD-10-CM | POA: Diagnosis not present

## 2015-12-14 DIAGNOSIS — E034 Atrophy of thyroid (acquired): Secondary | ICD-10-CM

## 2015-12-14 DIAGNOSIS — E038 Other specified hypothyroidism: Secondary | ICD-10-CM | POA: Diagnosis not present

## 2015-12-14 DIAGNOSIS — C7989 Secondary malignant neoplasm of other specified sites: Secondary | ICD-10-CM | POA: Diagnosis not present

## 2015-12-14 DIAGNOSIS — G4700 Insomnia, unspecified: Secondary | ICD-10-CM | POA: Diagnosis not present

## 2015-12-14 DIAGNOSIS — F0391 Unspecified dementia with behavioral disturbance: Secondary | ICD-10-CM

## 2015-12-14 DIAGNOSIS — C679 Malignant neoplasm of bladder, unspecified: Secondary | ICD-10-CM | POA: Diagnosis not present

## 2015-12-14 DIAGNOSIS — N3000 Acute cystitis without hematuria: Secondary | ICD-10-CM

## 2015-12-14 DIAGNOSIS — A419 Sepsis, unspecified organism: Secondary | ICD-10-CM

## 2015-12-14 DIAGNOSIS — F03918 Unspecified dementia, unspecified severity, with other behavioral disturbance: Secondary | ICD-10-CM

## 2015-12-14 DIAGNOSIS — G9341 Metabolic encephalopathy: Secondary | ICD-10-CM | POA: Diagnosis not present

## 2015-12-14 NOTE — Progress Notes (Signed)
Provider:   Location:  [place] Room Number: 102 Place of Service:  SNF (31)  PCP: No primary care provider on file. No care team member to display  Extended Emergency Contact Information Primary Emergency Contact: [facility] Address: [address] [place], [place] [address] [place] Phone: [phone] Mobile Phone: [phone] Relation: Son Secondary Emergency Contact: Sharene Butters Address: [place]          [place], [place] [address] [place] Phone: [phone] Mobile Phone: [phone] Relation: Relative  Code Status: DNR Goals of Care: Advanced Directive information Advanced Directives 12/14/2015  Does patient have an advance directive? No  Would patient like information on creating an advanced directive? No - patient declined information      Chief Complaint  Patient presents with  . New Admit To SNF    HPI: Patient is a 82 y.o. female seen today for admission to  Past Medical History  Diagnosis Date  . Dementia without behavioral disturbance   . Bladder cancer metastasized to pelvic region [facility])   . COPD (chronic obstructive pulmonary disease) ([place])   . Hypothyroid   . Hypertension    Past Surgical History  Procedure Laterality Date  . Ureteral stent placement Right     2/2 bladder CA  . Vaginal tumor      removed in remote past  . Eye surgery      cataract    reports that she has been smoking.  She has never used smokeless tobacco. She reports that she does not drink alcohol or use illicit drugs. Social History   Social History  . Marital Status: Widowed    Spouse Name: N/A  . Number of Children: N/A  . Years of Education: N/A   Occupational History  . Not on file.   Social History Main Topics  . Smoking status: Current Every Day Smoker  . Smokeless tobacco: Never Used  . Alcohol Use: No  . Drug Use: No  . Sexual Activity: No   Other Topics  Concern  . Not on file   Social History Narrative    Functional Status Survey:    No family history on file.  Health Maintenance  Topic Date Due  . TETANUS/TDAP  08/18/1947  . ZOSTAVAX  08/17/1988  . DEXA SCAN  08/17/1993  . PNA vac Low Risk Adult (1 of 2 - PCV13) 08/17/1993  . INFLUENZA VACCINE  05/16/2015    Allergies  Allergen Reactions  . Beta Adrenergic Blockers Other (See Comments)    Low heart rate   . Ciprofloxacin   . Combigan [Brimonidine Tartrate-Timolol]   . Iodine   . Sulfa Antibiotics   . Tetracyclines & Related       Medication List       This list is accurate as of: 12/14/15  2:02 PM.  Always use your most recent med list.               acetaminophen 325 MG tablet  Commonly known as:  TYLENOL  Take 650 mg by mouth every 4 (four) hours as needed for mild pain or moderate pain.     amLODipine 10 MG tablet  Commonly known as:  NORVASC  Take 10 mg by mouth daily.     bimatoprost 0.01 % Soln  Commonly known as:  LUMIGAN  Place 1 drop into both eyes at bedtime.  donepezil 5 MG tablet  Commonly known as:  ARICEPT  Take 5 mg by mouth at bedtime.     haloperidol 1 MG tablet  Commonly known as:  HALDOL  Take 1 mg by mouth 2 (two) times daily as needed for agitation.     HYDROcodone-acetaminophen 5-325 MG tablet  Commonly known as:  NORCO/VICODIN  Take 1 tablet by mouth every 6 (six) hours as needed for moderate pain.     levothyroxine 25 MCG tablet  Commonly known as:  SYNTHROID, LEVOTHROID  Take 50 mcg by mouth daily before breakfast.     MULTIVITAMIN & MINERAL PO  Take 1 tablet by mouth daily.     traZODone 50 MG tablet  Commonly known as:  DESYREL  Take 50 mg by mouth at bedtime.        Review of Systems  Filed Vitals:   12/14/15 1343  BP: 120/66  Pulse: 80  Temp: 97.5 F (36.4 C)  TempSrc: Oral  Resp: 20  Height: 5\' 5"  (1.651 m)  Weight: 111 lb (50.349 kg)   Body mass index is 18.47 kg/(m^2). Physical Exam  Labs  reviewed: Basic Metabolic Panel:  Recent Labs  12/10/15  NA 145  K 4.0  BUN 34*  CREATININE 1.0   Liver Function Tests:  Recent Labs  12/06/15  AST 35  ALT 22  ALKPHOS 91   No results for input(s): LIPASE, AMYLASE in the last 8760 hours. No results for input(s): AMMONIA in the last 8760 hours. CBC:  Recent Labs  12/10/15  WBC 8.3  HGB 11.1*  HCT 34*   Cardiac Enzymes: No results for input(s): CKTOTAL, CKMB, CKMBINDEX, TROPONINI in the last 8760 hours. BNP: Invalid input(s): POCBNP No results found for: HGBA1C No results found for: TSH No results found for: VITAMINB12 No results found for: FOLATE No results found for: IRON, TIBC, FERRITIN  Imaging and Procedures obtained prior to SNF admission: Dg Chest 2 View  03/04/2007  Clinical Data: Shortness of breath, cough, COPD; pre-catheterization.  CHEST - 2 VIEW: Comparison: There are no prior films for comparison purposes.  Findings: The cardiac silhouette, mediastinum and pulmonary vasculature are within normal limits. Both lungs are clear. Mild degenerative changes are seen within the axial spine.  IMPRESSION: There is no evidence of acute cardiac or pulmonary process.  Provider: Bryson Corona   Assessment/Plan There are no diagnoses linked to this encounter.   Family/ staff Communication:   Labs/tests ordered:

## 2015-12-17 ENCOUNTER — Encounter: Payer: Self-pay | Admitting: Internal Medicine

## 2015-12-17 DIAGNOSIS — N390 Urinary tract infection, site not specified: Secondary | ICD-10-CM | POA: Insufficient documentation

## 2015-12-17 DIAGNOSIS — R911 Solitary pulmonary nodule: Secondary | ICD-10-CM | POA: Insufficient documentation

## 2015-12-17 DIAGNOSIS — A419 Sepsis, unspecified organism: Secondary | ICD-10-CM | POA: Insufficient documentation

## 2015-12-17 DIAGNOSIS — G4700 Insomnia, unspecified: Secondary | ICD-10-CM | POA: Insufficient documentation

## 2015-12-17 NOTE — Assessment & Plan Note (Signed)
SNF - cont trazodone 50 mg qHS

## 2015-12-17 NOTE — Assessment & Plan Note (Signed)
2/2 UTI, tx with rocephin with neg urine and blood cultures

## 2015-12-17 NOTE — Assessment & Plan Note (Signed)
SNF - s/p BCG tx and stents; will monitor;outpt f/u with ONC

## 2015-12-17 NOTE — Assessment & Plan Note (Signed)
Improved, CT  And MRI brain NAD, no stroke; SNf - monitor MS

## 2015-12-17 NOTE — Assessment & Plan Note (Signed)
SNF - not stated as uncontrolled; cont synthroid 50 mg daily

## 2015-12-17 NOTE — Assessment & Plan Note (Signed)
SNf - continue aricept; pt was on risperdol prior, this was changed to prn haldol 2/2 lethargy

## 2015-12-17 NOTE — Assessment & Plan Note (Signed)
SNF - LUL, 1.7 cm by 1.5 cm; pt allergy to iodine, outpt PET scan suspicious for malignancy; f/u after d/c

## 2015-12-17 NOTE — Progress Notes (Signed)
MRN: [id number] Name: Annette [name]  Sex: female Age: 82 y.o. DOB: 27-Jul-1926  [place] [facility] Facility/Room:102 Level Of Care: SNF Provider: Inocencio Homes D Emergency Contacts: Extended Emergency Contact Information Primary Emergency Contact: [facility] Address: [address] [place], [place] [address] [place] Phone: [phone] Mobile Phone: [phone] Relation: Son Secondary Emergency Contact: Sharene Butters Address: [place]          [place], [place] [address] [place] Phone: [phone] Mobile Phone: [phone] Relation: Relative  Code Status:   Allergies: Beta adrenergic blockers; Ciprofloxacin; Combigan; Iodine; Sulfa antibiotics; and Tetracyclines & related  Chief Complaint  Patient presents with  . New Admit To SNF    HPI: Patient is 82 y.o. female with dementia, bladder CA with prior BCG tx and ureteral stents who presented with AMS. Pt was found on floor with head against ewall with some died bloodin area. Last contact with family 4 days prior. Pt was admitted to [facility] form 2/21-27 where she was treated for sepsis, 2/2 UTI, complicated by encephalopathy. Pt also had an incidental finding on CT Chest , a nodule LUL suspicious for malignancy that will need to be f/u as outpt. Pt is admitted to SNF for generalized weakness for OT/PT. While ar SNF pt will be followed for HTN, tx with norvasc,dementia with behavoits , tx with haldol and aricept and hypothryroid, tx with synthroid.  Past Medical History  Diagnosis Date  . Dementia without behavioral disturbance   . Bladder cancer metastasized to pelvic region [facility])   . COPD (chronic obstructive pulmonary disease) ([place])   . Hypothyroid   . Hypertension     Past Surgical History  Procedure Laterality Date  . Ureteral stent placement Right     2/2 bladder CA  . Vaginal tumor      removed in remote past  . Eye surgery      cataract      Medication List        This list is accurate as of: 12/14/15 11:59 PM.  Always use your most recent med list.               acetaminophen 325 MG tablet  Commonly known as:  TYLENOL  Take 650 mg by mouth every 4 (four) hours as needed for mild pain or moderate pain.     amLODipine 10 MG tablet  Commonly known as:  NORVASC  Take 10 mg by mouth daily.     bimatoprost 0.01 % Soln  Commonly known as:  LUMIGAN  Place 1 drop into both eyes at bedtime.     donepezil 5 MG tablet  Commonly known as:  ARICEPT  Take 5 mg by mouth at bedtime.     haloperidol 1 MG tablet  Commonly known as:  HALDOL  Take 1 mg by mouth 2 (two) times daily as needed for agitation.     HYDROcodone-acetaminophen 5-325 MG tablet  Commonly known as:  NORCO/VICODIN  Take 1 tablet by mouth every 6 (six) hours as needed for moderate pain.     levothyroxine 25 MCG tablet  Commonly known as:  SYNTHROID, LEVOTHROID  Take 50 mcg by mouth daily before breakfast.     MULTIVITAMIN & MINERAL PO  Take 1 tablet by mouth daily.     traZODone 50 MG tablet  Commonly known as:  DESYREL  Take 50 mg by mouth at bedtime.  No orders of the defined types were placed in this encounter.     There is no immunization history on file for this patient.  Social History  Substance Use Topics  . Smoking status: Current Every Day Smoker  . Smokeless tobacco: Never Used  . Alcohol Use: No    Family history is + CA  Review of Systems  DATA OBTAINED: from nurse GENERAL:  no fevers, fatigue, appetite changes SKIN: No itching, rash or wounds EYES: No eye pain, redness, discharge EARS: No earache, tinnitus, change in hearing NOSE: No congestion, drainage or bleeding  MOUTH/THROAT: No mouth or tooth pain, No sore throat RESPIRATORY: No cough, wheezing, SOB CARDIAC: No chest pain, palpitations, lower extremity edema  GI: No abdominal pain, No N/V/D or constipation, No heartburn or reflux  GU: No dysuria, frequency or urgency, or  incontinence  MUSCULOSKELETAL: No unrelieved bone/joint pain NEUROLOGIC: No headache, dizziness or focal weakness PSYCHIATRIC: No c/o anxiety or sadness   Filed Vitals:   12/17/15 1220  BP: 120/66  Pulse: 80  Temp: 97.5 F (36.4 C)  Resp: 20    SpO2 Readings from Last 1 Encounters:  No data found for SpO2        Physical Exam  GENERAL APPEARANCE: Alert, conversant,  No acute distress.  SKIN: No diaphoresis rash HEAD: Normocephalic, atraumatic  EYES: Conjunctiva/lids clear. Pupils round, reactive. EOMs intact.  EARS: External exam WNL, canals clear. Hearing grossly normal.  NOSE: No deformity or discharge.  MOUTH/THROAT: Lips w/o lesions  RESPIRATORY: Breathing is even, unlabored. Lung sounds are clear   CARDIOVASCULAR: Heart RRR  3/6 murmur, no rubs or gallops. No peripheral edema.   GASTROINTESTINAL: Abdomen is soft, non-tender, not distended w/ normal bowel sounds. GENITOURINARY: Bladder non tender, not distended  MUSCULOSKELETAL: No abnormal joints or musculature NEUROLOGIC:  Cranial nerves 2-12 grossly intact. Moves all extremities  PSYCHIATRIC: dementia, no behavioral issues  Patient Active Problem List   Diagnosis Date Noted  . Sepsis ([place]) 12/17/2015  . Pulmonary nodule, left 12/17/2015  . UTI (urinary tract infection) 12/17/2015  . Insomnia 12/17/2015  . Edema 05/02/2014  . HTN (hypertension) 05/02/2014  . AAA (abdominal aortic aneurysm) ([place]) 04/24/2014  . DDD (degenerative disc disease), lumbar 04/24/2014  . Encephalopathy, metabolic 99991111  . Hypertensive urgency 04/24/2014  . Dementia with behavioral disturbance   . Bladder cancer metastasized to pelvic region [facility])   . COPD (chronic obstructive pulmonary disease) ([place])   . Hypothyroid   . Hypertension   . Nicotine addiction 11/24/2013  . Lung nodule, multiple 10/01/2013    CBC    Component Value Date/Time   WBC 8.3 12/10/2015   HGB 11.1* 12/10/2015   HCT 34* 12/10/2015    CMP      Component Value Date/Time   NA 145 12/10/2015   K 4.0 12/10/2015   BUN 34* 12/10/2015   CREATININE 1.0 12/10/2015   AST 35 12/06/2015   ALT 22 12/06/2015   ALKPHOS 91 12/06/2015    No results found for: HGBA1C   Dg Chest 2 View  03/04/2007  Clinical Data: Shortness of breath, cough, COPD; pre-catheterization.  CHEST - 2 VIEW: Comparison: There are no prior films for comparison purposes.  Findings: The cardiac silhouette, mediastinum and pulmonary vasculature are within normal limits. Both lungs are clear. Mild degenerative changes are seen within the axial spine.  IMPRESSION: There is no evidence of acute cardiac or pulmonary process.  Provider: Bryson Corona   Not all labs, radiology exams or other  studies done during hospitalization come through on my EPIC note; however they are reviewed by me.    Assessment and Plan  Sepsis ([place]) 2/2 UTI, tx with rocephin with neg urine and blood cultures  Encephalopathy, metabolic Improved, CT  And MRI brain NAD, no stroke; SNf - monitor MS  Dementia with behavioral disturbance SNf - continue aricept; pt was on risperdol prior, this was changed to prn haldol 2/2 lethargy  Pulmonary nodule, left SNF - LUL, 1.7 cm by 1.5 cm; pt allergy to iodine, outpt PET scan suspicious for malignancy; f/u after d/c  UTI (urinary tract infection) SNF - cause of sepsis, tx with rocephin, urine cx neg  Hypothyroid SNF - not stated as uncontrolled; cont synthroid 50 mg daily  Insomnia SNF - cont trazodone 50 mg qHS  Bladder cancer metastasized to pelvic region SNF - s/p BCG tx and stents; will monitor;outpt f/u with ONC   Time spent > 45 min;> 50% of time with patient was spent reviewing records, labs, tests and studies, counseling and developing plan of care  Hennie Duos, MD   ` +

## 2015-12-17 NOTE — Assessment & Plan Note (Signed)
SNF - cause of sepsis, tx with rocephin, urine cx neg

## 2015-12-27 ENCOUNTER — Non-Acute Institutional Stay (SKILLED_NURSING_FACILITY): Payer: Medicare Other | Admitting: Internal Medicine

## 2015-12-27 DIAGNOSIS — R600 Localized edema: Secondary | ICD-10-CM | POA: Diagnosis not present

## 2015-12-27 NOTE — Progress Notes (Signed)
MRN: [id number] Name: Annette [name]  Sex: female Age: 82 y.o. DOB: 1925-11-22  [place] [facility] Facility/Room:102 Level Of Care: SNF Provider: Inocencio Homes D Emergency Contacts: Extended Emergency Contact Information Primary Emergency Contact: [facility] Address: [address] [place], [place] [address] [name] Phone: [phone] Mobile Phone: [phone] Relation: Son Secondary Emergency Contact: Sharene Butters Address: [place]          [place], [place] [address] [place] Phone: [phone] Mobile Phone: [phone] Relation: Relative  Code Status:   Allergies: Beta adrenergic blockers; Ciprofloxacin; Combigan; Iodine; Sulfa antibiotics; and Tetracyclines & related  Chief Complaint  Patient presents with  . Acute Visit    HPI: Patient is 82 y.o. female who nursing has asked me to see for swelling of both her legs.Nurse noted a little swelling yesterday, more today. Pt denies pain and nurse doesn't think so either. This is new. Nothing makes it better or worsePt with h/o bladder CA with ureteral stents and pelvic mets.  Past Medical History  Diagnosis Date  . Dementia without behavioral disturbance   . Bladder cancer metastasized to pelvic region [facility])   . COPD (chronic obstructive pulmonary disease) ([place])   . Hypothyroid   . Hypertension     Past Surgical History  Procedure Laterality Date  . Ureteral stent placement Right     2/2 bladder CA  . Vaginal tumor      removed in remote past  . Eye surgery      cataract      Medication List       This list is accurate as of: 12/27/15 11:59 PM.  Always use your most recent med list.               acetaminophen 325 MG tablet  Commonly known as:  TYLENOL  Take 650 mg by mouth every 4 (four) hours as needed for mild pain or moderate pain.     amLODipine 10 MG tablet  Commonly known as:  NORVASC  Take 10 mg by mouth daily.     bimatoprost 0.01 % Soln   Commonly known as:  LUMIGAN  Place 1 drop into both eyes at bedtime.     donepezil 5 MG tablet  Commonly known as:  ARICEPT  Take 5 mg by mouth at bedtime.     haloperidol 1 MG tablet  Commonly known as:  HALDOL  Take 1 mg by mouth 2 (two) times daily as needed for agitation.     HYDROcodone-acetaminophen 5-325 MG tablet  Commonly known as:  NORCO/VICODIN  Take 1 tablet by mouth every 6 (six) hours as needed for moderate pain.     levothyroxine 25 MCG tablet  Commonly known as:  SYNTHROID, LEVOTHROID  Take 50 mcg by mouth daily before breakfast.     MULTIVITAMIN & MINERAL PO  Take 1 tablet by mouth daily.     traZODone 50 MG tablet  Commonly known as:  DESYREL  Take 50 mg by mouth at bedtime.        No orders of the defined types were placed in this encounter.     There is no immunization history on file for this patient.  Social History  Substance Use Topics  . Smoking status: Current Every Day Smoker  . Smokeless tobacco: Never Used  . Alcohol Use: No    Review of Systems  DATA OBTAINED: from patient, nurse GENERAL:  no fevers, fatigue, appetite changes SKIN: No itching, rash HEENT: No complaint RESPIRATORY: No cough, wheezing, SOB CARDIAC: No chest pain, palpitations, + lower extremity edema  GI: No abdominal pain, No N/V/D or constipation, No heartburn or reflux  GU: No dysuria, frequency or urgency, or incontinence  MUSCULOSKELETAL: No unrelieved bone/joint pain NEUROLOGIC: No headache, dizziness  PSYCHIATRIC: No overt anxiety or sadness  Filed Vitals:   01/07/16 2251  BP: 109/67  Pulse: 57  Temp: 96.7 F (35.9 C)  Resp: 18    Physical Exam  GENERAL APPEARANCE: Alert, min conversant, No acute distress  SKIN: No diaphoresis rash HEENT: Unremarkable RESPIRATORY: Breathing is even, unlabored. Lung sounds are clear   CARDIOVASCULAR: Heart RRR no murmurs, rubs or gallops.+ peripheral edema R>L; L calf has bulging appearance; legs with mild  heat, no redness GASTROINTESTINAL: Abdomen is soft, non-tender, not distended w/ normal bowel sounds.  GENITOURINARY: Bladder non tender, not distended  MUSCULOSKELETAL: No abnormal joints or musculature NEUROLOGIC: Cranial nerves 2-12 grossly intact. Moves all extremities PSYCHIATRIC: Mood and affect appropriate to situation, no behavioral issues  Patient Active Problem List   Diagnosis Date Noted  . Sepsis ([place]) 12/17/2015  . Pulmonary nodule, left 12/17/2015  . UTI (urinary tract infection) 12/17/2015  . Insomnia 12/17/2015  . Edema 05/02/2014  . HTN (hypertension) 05/02/2014  . AAA (abdominal aortic aneurysm) ([place]) 04/24/2014  . DDD (degenerative disc disease), lumbar 04/24/2014  . Encephalopathy, metabolic 99991111  . Hypertensive urgency 04/24/2014  . Dementia with behavioral disturbance   . Bladder cancer metastasized to pelvic region [facility])   . COPD (chronic obstructive pulmonary disease) ([place])   . Hypothyroid   . Hypertension   . Nicotine addiction 11/24/2013  . Lung nodule, multiple 10/01/2013    CBC    Component Value Date/Time   WBC 8.3 12/10/2015   HGB 11.1* 12/10/2015   HCT 34* 12/10/2015    CMP     Component Value Date/Time   NA 145 12/10/2015   K 4.0 12/10/2015   BUN 34* 12/10/2015   CREATININE 1.0 12/10/2015   AST 35 12/06/2015   ALT 22 12/06/2015   ALKPHOS 91 12/06/2015    Assessment and Plan  BLE edema - with hx CA its thrombosis until proven otherwise; B doppler U/S has been ordered; will pursue other avenues if studies are neg   Hennie Duos, MD

## 2016-01-04 ENCOUNTER — Other Ambulatory Visit: Payer: Self-pay | Admitting: Internal Medicine

## 2016-01-04 DIAGNOSIS — R918 Other nonspecific abnormal finding of lung field: Secondary | ICD-10-CM

## 2016-01-07 ENCOUNTER — Encounter: Payer: Self-pay | Admitting: Internal Medicine

## 2016-01-09 ENCOUNTER — Ambulatory Visit (HOSPITAL_COMMUNITY)
Admission: RE | Admit: 2016-01-09 | Discharge: 2016-01-09 | Disposition: A | Discharge status: Home / Self Care | Payer: Medicare Other | Source: Ambulatory Visit | Attending: Internal Medicine | Admitting: Internal Medicine

## 2016-01-09 DIAGNOSIS — J90 Pleural effusion, not elsewhere classified: Secondary | ICD-10-CM | POA: Diagnosis not present

## 2016-01-09 DIAGNOSIS — R918 Other nonspecific abnormal finding of lung field: Secondary | ICD-10-CM | POA: Diagnosis present

## 2016-01-09 DIAGNOSIS — I712 Thoracic aortic aneurysm, without rupture: Secondary | ICD-10-CM | POA: Diagnosis not present

## 2016-01-17 NOTE — Progress Notes (Signed)
This encounter was created in error - please disregard.

## 2016-02-21 ENCOUNTER — Non-Acute Institutional Stay (SKILLED_NURSING_FACILITY): Payer: Medicare Other | Admitting: Internal Medicine

## 2016-02-21 ENCOUNTER — Encounter: Payer: Self-pay | Admitting: Internal Medicine

## 2016-02-21 DIAGNOSIS — I10 Essential (primary) hypertension: Secondary | ICD-10-CM | POA: Diagnosis not present

## 2016-02-21 DIAGNOSIS — G4700 Insomnia, unspecified: Secondary | ICD-10-CM

## 2016-02-21 DIAGNOSIS — J432 Centrilobular emphysema: Secondary | ICD-10-CM | POA: Diagnosis not present

## 2016-02-21 NOTE — Assessment & Plan Note (Signed)
Controlled on Norvasc 10 mg and lasix 20 mg daily;plan - cont current meds

## 2016-02-21 NOTE — Assessment & Plan Note (Signed)
No known exacerbations; continue with prn albuterol nebs

## 2016-02-21 NOTE — Progress Notes (Signed)
MRN: [id number] Name: Annette [name]  Sex: female Age: 82 y.o. DOB: [birth year]  [place] #: [place] [place]  Facility/Room: 419 -D Level Of Care: SNF Provider: Inocencio Homes MD  Emergency Contacts: Extended Emergency Contact Information Primary Emergency Contact: [facility] Address: [address] [place], [place] [address] [place] Phone: [phone] Mobile Phone: [phone] Relation: Son Secondary Emergency Contact: Sharene Butters Address: [place], [place] [address] [place] Phone: [phone] Mobile Phone: [phone] Relation: Relative  Code Status: DNR  Allergies: Contrast media; Beta adrenergic blockers; Ciprofloxacin; Combigan; Iodine; Moxifloxacin; Sulfa antibiotics; and Tetracyclines & related  Chief Complaint  Patient presents with  . Medical Management of Chronic Issues    HPI: Patient is 82 y.o. female who with bladder CA metastizised to pelvic region  Who is being seen for routine issues of HTN, COPD and insomnia.  Past Medical History  Diagnosis Date  . Dementia without behavioral disturbance   . Bladder cancer metastasized to pelvic region [facility])   . COPD (chronic obstructive pulmonary disease) ([place])   . Hypothyroid   . Hypertension     Past Surgical History  Procedure Laterality Date  . Ureteral stent placement Right     2/2 bladder CA  . Vaginal tumor      removed in remote past  . Eye surgery      cataract      Medication List       This list is accurate as of: 02/21/16  6:40 PM.  Always use your most recent med list.               acetaminophen 325 MG tablet  Commonly known as:  TYLENOL  Take 650 mg by mouth every 4 (four) hours as needed for mild pain or moderate pain.     albuterol (2.5 MG/3ML) 0.083% nebulizer solution  Commonly known as:  PROVENTIL  Take 2.5 mg by nebulization every 4 (four) hours as needed. For SOB     amLODipine 10 MG tablet  Commonly known as:   NORVASC  Take 10 mg by mouth daily.     bimatoprost 0.01 % Soln  Commonly known as:  LUMIGAN  Place 1 drop into both eyes at bedtime.     donepezil 5 MG tablet  Commonly known as:  ARICEPT  Take 5 mg by mouth at bedtime.     furosemide 20 MG tablet  Commonly known as:  LASIX  Take 20 mg by mouth daily.     levothyroxine 25 MCG tablet  Commonly known as:  SYNTHROID, LEVOTHROID  Take 50 mcg by mouth daily before breakfast.     MULTIVITAMIN & MINERAL PO  Take 1 tablet by mouth daily.     potassium chloride 10 MEQ tablet  Commonly known as:  K-DUR  Take 10 mEq by mouth daily.        Meds ordered this encounter  Medications  . albuterol (PROVENTIL) (2.5 MG/3ML) 0.083% nebulizer solution    Sig: Take 2.5 mg by nebulization every 4 (four) hours as needed. For SOB  . potassium chloride (K-DUR) 10 MEQ tablet    Sig: Take 10 mEq by mouth daily.  . furosemide (LASIX) 20 MG tablet    Sig: Take 20 mg by mouth daily.     There is no immunization history on file for this patient.  Social History  Substance  Use Topics  . Smoking status: Former Smoker -- 0.50 packs/day for 60 years    Types: Cigarettes  . Smokeless tobacco: Never Used  . Alcohol Use: No    Review of Systems  UTO 2/2 dementia; nursing without concerns    Filed Vitals:   02/21/16 1239  BP: 126/67  Pulse: 59  Temp: 97.8 F (36.6 C)  Resp: 16    Physical Exam  GENERAL APPEARANCE: Alert, conversant, No acute distress  SKIN: No diaphoresis rash HEENT: Unremarkable RESPIRATORY: Breathing is even, unlabored. Lung sounds are clear and decreased CARDIOVASCULAR: Heart RRR no murmurs, rubs or gallops; B legs wrapped GASTROINTESTINAL: Abdomen is soft, non-tender, not distended w/ normal bowel sounds.  GENITOURINARY: Bladder non tender, not distended  MUSCULOSKELETAL: No abnormal joints or musculature NEUROLOGIC: Cranial nerves 2-12 grossly intact. Moves all extremities PSYCHIATRIC: dementria, no behavioral  issues  Patient Active Problem List   Diagnosis Date Noted  . Sepsis ([place]) 12/17/2015  . Pulmonary nodule, left 12/17/2015  . UTI (urinary tract infection) 12/17/2015  . Insomnia 12/17/2015  . Edema 05/02/2014  . HTN (hypertension) 05/02/2014  . AAA (abdominal aortic aneurysm) ([place]) 04/24/2014  . DDD (degenerative disc disease), lumbar 04/24/2014  . Encephalopathy, metabolic 99991111  . Hypertensive urgency 04/24/2014  . Dementia with behavioral disturbance   . Bladder cancer metastasized to pelvic region [facility])   . COPD (chronic obstructive pulmonary disease) ([place])   . Hypothyroid   . Hypertension   . Nicotine addiction 11/24/2013  . Lung nodule, multiple 10/01/2013    CBC    Component Value Date/Time   WBC 8.3 12/10/2015   HGB 11.1* 12/10/2015   HCT 34* 12/10/2015    CMP     Component Value Date/Time   NA 145 12/10/2015   K 4.0 12/10/2015   BUN 34* 12/10/2015   CREATININE 1.0 12/10/2015   AST 35 12/06/2015   ALT 22 12/06/2015   ALKPHOS 91 12/06/2015    Assessment and Plan  HTN (hypertension) Controlled on Norvasc 10 mg and lasix 20 mg daily;plan - cont current meds  COPD (chronic obstructive pulmonary disease) No known exacerbations; continue with prn albuterol nebs  Insomnia Trazodone has been d/c 2/2 non use and pt with no c/o sleep problems    Inocencio Homes  MD

## 2016-02-21 NOTE — Assessment & Plan Note (Signed)
Trazodone has been d/c 2/2 non use and pt with no c/o sleep problems

## 2016-02-24 LAB — CBC AND DIFFERENTIAL
HEMATOCRIT: 35 % — AB (ref 36–46)
HEMOGLOBIN: 10.9 g/dL — AB (ref 12.0–16.0)
Platelets: 192 10*3/uL (ref 150–399)
WBC: 7.9 10*3/mL

## 2016-02-24 LAB — BASIC METABOLIC PANEL
BUN: 45 mg/dL — AB (ref 4–21)
Creatinine: 1.2 mg/dL — AB (ref 0.5–1.1)
Glucose: 133 mg/dL
POTASSIUM: 4.2 mmol/L (ref 3.4–5.3)
Sodium: 142 mmol/L (ref 137–147)

## 2016-02-24 LAB — LIPID PANEL
CHOLESTEROL: 177 mg/dL (ref 0–200)
HDL: 67 mg/dL (ref 35–70)
LDL CALC: 91 mg/dL
Triglycerides: 97 mg/dL (ref 40–160)

## 2016-02-24 LAB — HEPATIC FUNCTION PANEL
ALT: 11 U/L (ref 7–35)
AST: 18 U/L (ref 13–35)
Alkaline Phosphatase: 92 U/L (ref 25–125)
Bilirubin, Total: 0.3 mg/dL

## 2016-02-24 LAB — TSH: TSH: 2.48 u[IU]/mL (ref 0.41–5.90)

## 2016-02-24 LAB — HEMOGLOBIN A1C: Hemoglobin A1C: 5.5

## 2016-03-05 ENCOUNTER — Encounter: Payer: Self-pay | Admitting: Internal Medicine

## 2016-03-05 ENCOUNTER — Non-Acute Institutional Stay (SKILLED_NURSING_FACILITY): Payer: Medicare Other | Admitting: Internal Medicine

## 2016-03-05 DIAGNOSIS — F0391 Unspecified dementia with behavioral disturbance: Secondary | ICD-10-CM | POA: Diagnosis not present

## 2016-03-05 DIAGNOSIS — R609 Edema, unspecified: Secondary | ICD-10-CM

## 2016-03-05 DIAGNOSIS — F03918 Unspecified dementia, unspecified severity, with other behavioral disturbance: Secondary | ICD-10-CM

## 2016-03-05 LAB — BASIC METABOLIC PANEL
BUN: 53 mg/dL — AB (ref 4–21)
Creatinine: 1.6 mg/dL — AB (ref 0.5–1.1)
Glucose: 146 mg/dL
Potassium: 4.4 mmol/L (ref 3.4–5.3)
SODIUM: 144 mmol/L (ref 137–147)

## 2016-03-05 NOTE — Progress Notes (Signed)
Location:  [place] Room Number: 419/D Place of Service:  SNF [phone]) Provider:  Granville Lewis  No primary care provider on file.  No care team member to display  Extended Emergency Contact Information Primary Emergency Contact: [facility] Address: [address] [place], [place] [address] [place] Phone: [phone] Mobile Phone: [phone] Relation: Son Secondary Emergency Contact: Sharene Butters Address: [address]          [place], [place] [address] [place] Phone: [phone] Mobile Phone: [phone] Relation: Relative  Code Status:  DNR Goals of care: Advanced Directive information Advanced Directives 03/05/2016  Does patient have an advance directive? Yes  Type of Advance Directive Out of facility DNR (pink MOST or yellow form)  Does patient want to make changes to advanced directive? No - Patient declined  Copy of advanced directive(s) in chart? Yes     Chief Complaint  Patient presents with  . Acute Visit  Secondary to renal insufficiency  HPI:  Pt is a 82 y.o. female seen today for an acute visit for elevated creatinine from baseline Her baseline creatinine appears to be in the low ones we have done recent lab which showed head risen of 1.15 in now is 1.58.  She is on low-dose Lasix apparently to increased lower extremity edema which appears to have improved somewhat according to patient and nursing staff.  Venous Dopplers of her lower extremities back in March were negative for any DVT.  Patient does have a medical history of bladder cancer with ureteral stents and pelvic metastasis.  Clinically she appears to be stable does not have any acute complaints today thinks her edema has gotten a bit better-she does have some history of dementia and is a somewhat poor historian.  .     Past Medical History  Diagnosis Date  . Dementia without behavioral disturbance   .  Bladder cancer metastasized to pelvic region [facility])   . COPD (chronic obstructive pulmonary disease) ([place])   . Hypothyroid   . Hypertension    Past Surgical History  Procedure Laterality Date  . Ureteral stent placement Right     2/2 bladder CA  . Vaginal tumor      removed in remote past  . Eye surgery      cataract    Allergies  Allergen Reactions  . Contrast Media [Iodinated Diagnostic Agents] Hives  . Beta Adrenergic Blockers Other (See Comments)    Low heart rate   . Ciprofloxacin   . Combigan [Brimonidine Tartrate-Timolol]   . Iodine   . Moxifloxacin     Other reaction(s): Dizziness (intolerance)  . Sulfa Antibiotics   . Tetracyclines & Related       Medication List       This list is accurate as of: 03/05/16 12:44 PM.  Always use your most recent med list.               acetaminophen 325 MG tablet  Commonly known as:  TYLENOL  Take 650 mg by mouth every 4 (four) hours as needed for mild pain or moderate pain.     albuterol (2.5 MG/3ML) 0.083% nebulizer solution  Commonly known as:  PROVENTIL  Take 2.5 mg by nebulization every 4 (four) hours as needed. For SOB     amLODipine 10 MG tablet  Commonly known as:  NORVASC  Take 10 mg by mouth daily.  bimatoprost 0.01 % Soln  Commonly known as:  LUMIGAN  Place 1 drop into both eyes at bedtime.     donepezil 5 MG tablet  Commonly known as:  ARICEPT  Take 5 mg by mouth at bedtime.     furosemide 20 MG tablet  Commonly known as:  LASIX  Take 20 mg by mouth daily.     levothyroxine 25 MCG tablet  Commonly known as:  SYNTHROID, LEVOTHROID  Take 50 mcg by mouth daily before breakfast.     MULTIVITAMIN & MINERAL PO  Take 1 tablet by mouth daily.     potassium chloride 10 MEQ tablet  Commonly known as:  K-DUR  Take 10 mEq by mouth daily.        Review of Systems   There is not complaining of any fever chills or shortness of breath.  Skin does not complain of rashes or itching.  Head ears eyes  nose mouth and throat no complaints of sore throat or visual changes.  Respiratory does not complain of any shortness of breath or cough.  Cardiac no chest pain again lower extremity edema somewhat chronic apparently this has improved somewhat with Lasix.  GI is not complaining of abdominal discomfort nausea vomiting diarrhea constipation think she has 2 good" appetite.  Muscle skeletal is not complaining of joint pain.  Neurologic does not complain of dizziness headache or syncopal-type feelings   There is no immunization history on file for this patient. Pertinent  Health Maintenance Due  Topic Date Due  . DEXA SCAN  03/05/2017 (Originally 08/17/1993)  . PNA vac Low Risk Adult (1 of 2 - PCV13) 03/05/2017 (Originally 08/17/1993)  . INFLUENZA VACCINE  05/15/2016   No flowsheet data found. Functional Status Survey:    Filed Vitals:   03/05/16 1235  BP: 133/76  Pulse: 61  Temp: 97.2 F (36.2 C)  TempSrc: Oral  Resp: 20  Height: 5\' 5"  (1.651 m)  Weight: 117 lb (53.071 kg)   Body mass index is 19.47 kg/(m^2). Physical Exam   In general this is a pleasant elderly resident who is conversant today sitting comfortably in her wheelchair.  Her skin is warm and dry.  Oropharynx clear mucous membranes moist.  Eyes does have prescription lenses visual acuity appears grossly intact.  Oropharynx is clear mucous membranes appear fairly moist.  Chest is clear to auscultation there is no labored breathing.  Heart is regular rate and rhythm slightly bradycardic in the high 50s she has 1+ lower extremity edema bilaterally right greater than left this appears to be the case with Dr. [name] saw her although apparently edema has gotten somewhat better.  She has compression hose on.  Abdomen is soft nontender positive bowel sounds.  Muscle skeletal does ambulate in a wheelchair is able to move all extremities 40 deformities noted.  Neurologic is grossly intact her speech is clear  no lateralizing findings.  Psych she is oriented to self is conversant could tell me the day of the week but could not tell me the year or month.    Labs reviewed:  03/02/2016.  Sodium 144 potassium 4.4 BUN 53 creatinine 1.5 a.  I do note previous creatinine earlier this month was 1.15  Recent Labs  12/10/15  NA 145  K 4.0  BUN 34*  CREATININE 1.0    Recent Labs  12/06/15  AST 35  ALT 22  ALKPHOS 91    Recent Labs  12/10/15  WBC 8.3  HGB 11.1*  HCT  34*       Significant Diagnostic Results in last 30 days:  No results found.  Assessment/Plan 1 renal insufficiency-clinically patient appears to be doing well edema  Has improved somewhat-concerned about the rising creatinine however will hold the Lasix for now and update a metabolic panel later this week continue to monitor the edema suspect we may be restarting this had a lower dose but would like to see what update a metabolic panel tells [place] later this week there is no complaints of shortness of breath here.  Dementia with behavioral disturbance-this appears to be quite stable- is on Aricept also has when necessary Haldol although apparently this has not really been used.  She is pleasant and conversant today   [phone]   Oralia Manis, [place]

## 2016-03-08 ENCOUNTER — Non-Acute Institutional Stay (SKILLED_NURSING_FACILITY): Payer: Medicare Other | Admitting: Internal Medicine

## 2016-03-08 DIAGNOSIS — R609 Edema, unspecified: Secondary | ICD-10-CM

## 2016-03-08 DIAGNOSIS — N289 Disorder of kidney and ureter, unspecified: Secondary | ICD-10-CM

## 2016-03-08 NOTE — Progress Notes (Signed)
Patient ID: Annette [name], female   DOB: 08/25/1926, 82 y.o.   MRN: [id number]  Location:  [place] of Service:  SNF [phone]) Provider:  Granville Lewis  No primary care provider on file.  No care team member to display  Extended Emergency Contact Information Primary Emergency Contact: [facility] Address: [address] [place], [place] [address] [place] Phone: [phone] Mobile Phone: [phone] Relation: Son Secondary Emergency Contact: Sharene Butters Address: [address]          [place], [place] [address] [place] Phone: [phone] Mobile Phone: [phone] Relation: Relative  Code Status:  DNR Goals of care: Advanced Directive information Advanced Directives 03/05/2016  Does patient have an advance directive? Yes  Type of Advance Directive Out of facility DNR (pink MOST or yellow form)  Does patient want to make changes to advanced directive? No - Patient declined  Copy of advanced directive(s) in chart? Yes     Chief Complaint  Patient presents with  . Acute Visit  Follow-up edema with history of renal insufficiency  HPI:   Patient is an elderly female who is seen today for follow-up renal insufficiency-we have done serial BMPs lately which showed creatinine has risen somewhat from 1.15 up to 1.58 on lab done most recently.  When I saw her earlier this week I did hold her Lasix low dose 20 mg with hopes this would help with her creatinine.  She does have a history of lower extremity edema this has improved according nursing staff on the Lasix.  We have ordered labs today but they are not available yet at this time-clinically today she appears to be at baseline edema does not really appear to be changed from when I saw her earlier this week she does not report any increase shortness of breath cough or congestion.    Venous Dopplers of her lower extremities back in March were negative for  any DVT.  Patient does have a medical history of bladder cancer with ureteral stents and pelvic metastasis.   .  .     Past Medical History  Diagnosis Date  . Dementia without behavioral disturbance   . Bladder cancer metastasized to pelvic region [facility])   . COPD (chronic obstructive pulmonary disease) ([place])   . Hypothyroid   . Hypertension    Past Surgical History  Procedure Laterality Date  . Ureteral stent placement Right     2/2 bladder CA  . Vaginal tumor      removed in remote past  . Eye surgery      cataract    Allergies  Allergen Reactions  . Contrast Media [Iodinated Diagnostic Agents] Hives  . Beta Adrenergic Blockers Other (See Comments)    Low heart rate   . Ciprofloxacin   . Combigan [Brimonidine Tartrate-Timolol]   . Iodine   . Moxifloxacin     Other reaction(s): Dizziness (intolerance)  . Sulfa Antibiotics   . Tetracyclines & Related       Medication List       This list is accurate as of: 03/08/16 11:59 PM.  Always use your most recent med list.               acetaminophen 325 MG tablet  Commonly known as:  TYLENOL  Take 650 mg by mouth every 4 (four) hours as needed for mild pain or moderate pain.  albuterol (2.5 MG/3ML) 0.083% nebulizer solution  Commonly known as:  PROVENTIL  Take 2.5 mg by nebulization every 4 (four) hours as needed. For SOB     amLODipine 10 MG tablet  Commonly known as:  NORVASC  Take 10 mg by mouth daily.     bimatoprost 0.01 % Soln  Commonly known as:  LUMIGAN  Place 1 drop into both eyes at bedtime.     donepezil 5 MG tablet  Commonly known as:  ARICEPT  Take 5 mg by mouth at bedtime.     furosemide 20 MG tablet  Commonly known as:  LASIX  Take 20 mg by mouth daily.     levothyroxine 25 MCG tablet  Commonly known as:  SYNTHROID, LEVOTHROID  Take 50 mcg by mouth daily before breakfast.     MULTIVITAMIN & MINERAL PO  Take 1 tablet by mouth daily.     potassium chloride 10 MEQ tablet    Commonly known as:  K-DUR  Take 10 mEq by mouth daily.      As noted above Lasix continues to be on hold for now  Review of Systems   not complaining of any fever chills or shortness of breath.     Respiratory does not complain of any shortness of breath or cough.  Cardiac no chest pain again lower extremity edema somewhat chronic apparently this has improved somewhat with Lasix. Does not really appear to be increased from earlier this week  GI is not complaining of abdominal discomfort nausea vomiting diarrhea constipation think she has 2 good" appetite.  Muscle skeletal is not complaining of joint pain.  Neurologic does not complain of dizziness headache or syncopal-type feelings   There is no immunization history on file for this patient. Pertinent  Health Maintenance Due  Topic Date Due  . DEXA SCAN  03/05/2017 (Originally 08/17/1993)  . PNA vac Low Risk Adult (1 of 2 - PCV13) 03/05/2017 (Originally 08/17/1993)  . INFLUENZA VACCINE  05/15/2016   No flowsheet data found. Functional Status Survey:    Filed Vitals:   03/10/16 1107  BP: 142/69  Pulse: 61  Temp: 99.1 F (37.3 C)  Resp: 20   There is no weight on file to calculate BMI. Physical Exam   In general this is a pleasant elderly resident who is conversant today sitting comfortably in her wheelchair.  Her skin is warm and dry.  Oropharynx clear mucous membranes moist.     Oropharynx is clear mucous membranes appear fairly moist.  Chest is clear to auscultation there is no labored breathing.  Heart is regular rate and rhythm slightly bradycardic in the high 50s she has 1+ lower extremity edema bilaterally right greater than left This does not appear significant change from exam earlier this week  She has compression hose on.  Abdomen is soft nontender positive bowel sounds.  Muscle skeletal does ambulate in a wheelchair is able to move all extremities 40 deformities noted.  Neurologic is  grossly intact her speech is clear no lateralizing findings.  Psych she is oriented to self ending compatible with dementia    Labs reviewed:  03/02/2016.  Sodium 144 potassium 4.4 BUN 53 creatinine 1.58.  I do note previous creatinine earlier this month was 1.15  Recent Labs  12/10/15  NA 145  K 4.0  BUN 34*  CREATININE 1.0    Recent Labs  12/06/15  AST 35  ALT 22  ALKPHOS 91    Recent Labs  12/10/15  WBC  8.3  HGB 11.1*  HCT 34*       Significant Diagnostic Results in last 30 days:  No results found.  Assessment/Plan 1 renal insufficiency- As noted above Lasix currently on hold-edema appears to be relatively at baseline and clinically she appears to be doing well does not complaining of any shortness of breath chest pain increased cough.  I note her systolic is mildly elevated at 142/69-at this point continue to monitor-will continue hold the Lasix until we get these labs but I do not see any concerning changes today with her edema or clinical status   [id number]        Granville Lewis, PA-C [phone]

## 2016-03-10 ENCOUNTER — Encounter: Payer: Self-pay | Admitting: Internal Medicine

## 2016-03-12 LAB — BASIC METABOLIC PANEL
BUN: 39 mg/dL — AB (ref 4–21)
Creatinine: 1.4 mg/dL — AB (ref <=1.1)
GLUCOSE: 110 mg/dL
POTASSIUM: 4.2 mmol/L (ref 3.4–5.3)
SODIUM: 141 mmol/L (ref 137–147)

## 2016-03-15 ENCOUNTER — Encounter: Payer: Self-pay | Admitting: Internal Medicine

## 2016-03-15 ENCOUNTER — Non-Acute Institutional Stay (SKILLED_NURSING_FACILITY): Payer: Medicare Other | Admitting: Internal Medicine

## 2016-03-15 DIAGNOSIS — N289 Disorder of kidney and ureter, unspecified: Secondary | ICD-10-CM

## 2016-03-15 DIAGNOSIS — R609 Edema, unspecified: Secondary | ICD-10-CM | POA: Diagnosis not present

## 2016-03-15 NOTE — Progress Notes (Signed)
Location:  [facility] Room Number: 419/D Place of Service:  SNF [phone]) Provider:  Granville Lewis  No primary care provider on file.  No care team member to display  Extended Emergency Contact Information Primary Emergency Contact: [facility] Address: [address] [place], [place] [address] [place] Phone: [phone] Mobile Phone: [phone] Relation: Son Secondary Emergency Contact: Sharene Butters Address: [address]          [place], [place] [address] [place] Phone: [phone] Mobile Phone: [phone] Relation: Relative  Code Status:  DNR Goals of care: Advanced Directive information Advanced Directives 03/15/2016  Does patient have an advance directive? Yes  Type of Advance Directive Out of facility DNR (pink MOST or yellow form)  Does patient want to make changes to advanced directive? No - Patient declined  Copy of advanced directive(s) in chart? Yes     Chief Complaint  Patient presents with  . Follow-up    For renal diff.    HPI:  Pt is a 82 y.o. female seen today for an acute visit for Follow-up of renal insufficiency-she does have a history of chronic lower extremity edema had been on Lasix 20 mg a day we have been holding this for concerns of elevated creatinine-her baseline appears to be around 1 gross to 1.58 in the middle of May and we held her Lasix-it appears her edema remains at baseline-her creatinine is starting come down at 1.37 on lab done on May 25-BUN is 14 this is also down from 53.  Her vital signs are stable she does not complain of any increased shortness of breath nursing staff has not noted any increased edema from baseline.     Past Medical History  Diagnosis Date  . Dementia without behavioral disturbance   . Bladder cancer metastasized to pelvic region [facility])   . COPD (chronic obstructive pulmonary disease) ([place])   . Hypothyroid   . Hypertension     Past Surgical History  Procedure Laterality Date  . Ureteral stent placement Right     2/2 bladder CA  . Vaginal tumor      removed in remote past  . Eye surgery      cataract    Allergies  Allergen Reactions  . Contrast Media [Iodinated Diagnostic Agents] Hives  . Beta Adrenergic Blockers Other (See Comments)    Low heart rate   . Ciprofloxacin   . Combigan [Brimonidine Tartrate-Timolol]   . Iodine   . Moxifloxacin     Other reaction(s): Dizziness (intolerance)  . Sulfa Antibiotics   . Tetracyclines & Related       Medication List       This list is accurate as of: 03/15/16  2:43 PM.  Always use your most recent med list.               acetaminophen 325 MG tablet  Commonly known as:  TYLENOL  Take 650 mg by mouth every 4 (four) hours as needed for mild pain or moderate pain.     albuterol (2.5 MG/3ML) 0.083% nebulizer solution  Commonly known as:  PROVENTIL  Take 2.5 mg by nebulization every 4 (four) hours as needed. For SOB     amLODipine 10 MG tablet  Commonly known as:  NORVASC  Take 10 mg by mouth daily.     bimatoprost 0.01 % Soln  Commonly known as:  LUMIGAN  Place 1 drop into both eyes at bedtime.     donepezil 5 MG tablet  Commonly known as:  ARICEPT  Take 5 mg by mouth at bedtime.     furosemide 20 MG tablet  Commonly known as:  LASIX  Take 20 mg by mouth daily.     levothyroxine 25 MCG tablet  Commonly known as:  SYNTHROID, LEVOTHROID  Take 50 mcg by mouth daily before breakfast.     MULTIVITAMIN & MINERAL PO  Take 1 tablet by mouth daily.     potassium chloride 10 MEQ tablet  Commonly known as:  K-DUR  Take 10 mEq by mouth daily.        Review of Systems This is limited secondary to dementia obtain from patient and nursing.  In general does not complain of fever or chills.  Respiratory is not complaining of any increased shortness breath or cough.  Cardiac no chest pain edema appears to be at baseline she thinks is gotten a  little better.  Musculoskeletal does not complain of joint pain.  Neurologic is not complaining of syncope or headaches.  Psych does have a history of dementia no behaviors have been noted  There is no immunization history on file for this patient. Pertinent  Health Maintenance Due  Topic Date Due  . DEXA SCAN  03/05/2017 (Originally 08/17/1993)  . PNA vac Low Risk Adult (1 of 2 - PCV13) 03/05/2017 (Originally 08/17/1993)  . INFLUENZA VACCINE  05/15/2016   No flowsheet data found. Functional Status Survey:    Filed Vitals:   03/15/16 1434  BP: 119/67  Pulse: 78  Temp: 97.3 F (36.3 C)  TempSrc: Oral  Resp: 20  Height: 5\' 5"  (1.651 m)  Weight: 117 lb (53.071 kg)   Body mass index is 19.47 kg/(m^2). Physical Exam In general this is a pleasant elderly female in no distress.  Her skin is warm and dry.  Heart is regular rate and rhythm without murmur gallop or rub.  She has baseline I would say one plus lower extremity edema bilaterally a bit more on the right versus the left which is chronic.  Her chest is clear there is shallow air entry there is no labored breathing.  Abdomen is soft nontender positive bowel sounds.  Musculoskeletal does move all extremities 4 at baseline she does ambulate in a wheelchair.  Neurologic is grossly intact no lateralizing findings cranial nerves appear grossly intact her speech is clear.  Psych she is oriented to self pleasant but confused.   Labs reviewed:   03/08/2016.  Sodium 141 potassium 4.2 BUN 39 creatinine 1.37.  03/02/2016.  Sodium 144 potassium 4.4 BUN 53 creatinine 1.58.    Recent Labs  12/10/15  NA 145  K 4.0  BUN 34*  CREATININE 1.0    Recent Labs  12/06/15  AST 35  ALT 22  ALKPHOS 91    Recent Labs  12/10/15  WBC 8.3  HGB 11.1*  HCT 34*   No results found for: TSH No results found for: HGBA1C No results found for: CHOL, HDL, LDLCALC, LDLDIRECT, TRIG, CHOLHDL  Significant Diagnostic  Results in last 30 days:  No results found.  Assessment/Plan Renal insufficiency-this appears to be improving since patient has been off Lasix-edema remains at baseline-clinically she appears stable without  shortness of breath or chest pain.  At this point continue to monitor edema-clinical status is stable.  We'll continue to hold Lasix and obtain a metabolic panel for updated values  [id number]  Oralia Manis, [place]

## 2016-03-19 LAB — BASIC METABOLIC PANEL
BUN: 40 mg/dL — AB (ref 4–21)
CREATININE: 1.5 mg/dL — AB (ref 0.5–1.1)
Glucose: 141 mg/dL
POTASSIUM: 4.6 mmol/L (ref 3.4–5.3)
SODIUM: 142 mmol/L (ref 137–147)

## 2016-03-23 ENCOUNTER — Non-Acute Institutional Stay (SKILLED_NURSING_FACILITY): Payer: Medicare Other | Admitting: Internal Medicine

## 2016-03-23 ENCOUNTER — Encounter: Payer: Self-pay | Admitting: Internal Medicine

## 2016-03-23 DIAGNOSIS — N289 Disorder of kidney and ureter, unspecified: Secondary | ICD-10-CM | POA: Diagnosis not present

## 2016-03-23 DIAGNOSIS — R609 Edema, unspecified: Secondary | ICD-10-CM | POA: Diagnosis not present

## 2016-03-23 NOTE — Progress Notes (Deleted)
Location:  [place] [facility] Room Number: 419/D Place of Service:  SNF [phone]) Provider:  Granville Lewis  No primary care provider on file.  No care team member to display  Extended Emergency Contact Information Primary Emergency Contact: [facility] Address: [address] [place], [place] [address] [place] Phone: [phone] Mobile Phone: [phone] Relation: Son Secondary Emergency Contact: Sharene Butters Address: [address]          [place],  [address] [place] Phone: [phone] Mobile Phone: [phone] Relation: Relative  Code Status:  DNR Goals of care: Advanced Directive information Advanced Directives 03/23/2016  Does patient have an advance directive? Yes  Type of Advance Directive Out of facility DNR (pink MOST or yellow form)  Does patient want to make changes to advanced directive? No - Patient declined  Copy of advanced directive(s) in chart? Yes     Chief Complaint  Patient presents with  . Follow-up    Follow-up for edema    HPI:  Pt is a 82 y.o. female seen today for an acute visit for    Past Medical History  Diagnosis Date  . Dementia without behavioral disturbance   . Bladder cancer metastasized to pelvic region [facility])   . COPD (chronic obstructive pulmonary disease) ([place])   . Hypothyroid   . Hypertension    Past Surgical History  Procedure Laterality Date  . Ureteral stent placement Right     2/2 bladder CA  . Vaginal tumor      removed in remote past  . Eye surgery      cataract    Allergies  Allergen Reactions  . Contrast Media [Iodinated Diagnostic Agents] Hives  . Beta Adrenergic Blockers Other (See Comments)    Low heart rate   . Ciprofloxacin   . Combigan [Brimonidine Tartrate-Timolol]   . Iodine   . Moxifloxacin     Other reaction(s): Dizziness (intolerance)  . Sulfa Antibiotics   . Tetracyclines & Related       Medication List      This list is accurate as of: 03/23/16  2:59 PM.  Always use your most recent med list.               acetaminophen 325 MG tablet  Commonly known as:  TYLENOL  Take 650 mg by mouth every 4 (four) hours as needed for mild pain or moderate pain.     albuterol (2.5 MG/3ML) 0.083% nebulizer solution  Commonly known as:  PROVENTIL  Take 2.5 mg by nebulization every 4 (four) hours as needed. For SOB     amLODipine 10 MG tablet  Commonly known as:  NORVASC  Take 10 mg by mouth daily.     bimatoprost 0.01 % Soln  Commonly known as:  LUMIGAN  Place 1 drop into both eyes at bedtime.     donepezil 5 MG tablet  Commonly known as:  ARICEPT  Take 5 mg by mouth at bedtime.     furosemide 20 MG tablet  Commonly known as:  LASIX  Take 20 mg by mouth daily.     levothyroxine 25 MCG tablet  Commonly known as:  SYNTHROID, LEVOTHROID  Take 50 mcg by mouth daily before breakfast.     MULTIVITAMIN & MINERAL PO  Take 1 tablet by mouth daily.     potassium chloride 10 MEQ tablet  Commonly known as:  K-DUR  Take 10 mEq by mouth daily.        Review of Systems   There is no immunization history on file for this patient. Pertinent  Health Maintenance Due  Topic Date Due  . DEXA SCAN  03/05/2017 (Originally 08/17/1993)  . PNA vac Low Risk Adult (1 of 2 - PCV13) 03/05/2017 (Originally 08/17/1993)  . INFLUENZA VACCINE  05/15/2016   No flowsheet data found. Functional Status Survey:    Filed Vitals:   03/23/16 1452  BP: 155/62  Pulse: 60  Resp: 16  Height: 5\' 5"  (1.651 m)  Weight: 117 lb (53.071 kg)   Body mass index is 19.47 kg/(m^2). Physical Exam  Labs reviewed:  Recent Labs  12/10/15 03/12/16  NA 145 141  K 4.0 4.2  BUN 34* 39*  CREATININE 1.0 1.4*    Recent Labs  12/06/15  AST 35  ALT 22  ALKPHOS 91    Recent Labs  12/10/15  WBC 8.3  HGB 11.1*  HCT 34*   No results found for: TSH No results found for: HGBA1C No results found for: CHOL, HDL, LDLCALC,  LDLDIRECT, TRIG, CHOLHDL  Significant Diagnostic Results in last 30 days:  No results found.  Assessment/Plan There are no diagnoses linked to this encounter.      Oralia Manis, [place]

## 2016-03-25 ENCOUNTER — Encounter: Payer: Self-pay | Admitting: Internal Medicine

## 2016-03-25 NOTE — Progress Notes (Signed)
Patient ID: Annette [name], female   DOB: 05-06-1926, 82 y.o.   MRN: [id number]  Location:  [facility] Room Number: 419/D Place of Service:  SNF [phone]) Provider:  Granville Lewis  No primary care provider on file.  No care team member to display  Extended Emergency Contact Information Primary Emergency Contact: [facility] Address: [address] [place], [place] [address] [place] Phone: [phone] Mobile Phone: [phone] Relation: Son Secondary Emergency Contact: Sharene Butters Address: [address]          [place], [place] [address] [place] Phone: [phone] Mobile Phone: [phone] Relation: Relative  Code Status:  DNR Goals of care: Advanced Directive information Advanced Directives 03/23/2016  Does patient have an advance directive? Yes  Type of Advance Directive Out of facility DNR (pink MOST or yellow form)  Does patient want to make changes to advanced directive? No - Patient declined  Copy of advanced directive(s) in chart? Yes     Chief Complaint  Patient presents with  . Follow-up    Follow-up for edema  . Acute Visit  Follow-up lower extremity edema-renal sufficiency-  HPI:  Pt is a 82 y.o. female seen today for an acute visit for Follow-up of edema complicated with renal insufficiency.  She had been on Lasix 20 mg a day we have been holding this for concerns of elevated creatinine-her baseline appears to be around 1 rose to 1.58 in the middle of May and we held her Lasix-it appears her edema the last few days has increased somewhat according to nursing she is having some weeping of her legs.  Her creatinine has gone down some was down to 1.37 back on May 25 it's 1.450 most recent lab done 03/19/2016.  She does not complain of any increased shortness of breath vital signs appear to be stable.          Past Medical History  Diagnosis Date  . Dementia without  behavioral disturbance   . Bladder cancer metastasized to pelvic region [facility])   . COPD (chronic obstructive pulmonary disease) ([place])   . Hypothyroid   . Hypertension    Past Surgical History  Procedure Laterality Date  . Ureteral stent placement Right     2/2 bladder CA  . Vaginal tumor      removed in remote past  . Eye surgery      cataract    Allergies  Allergen Reactions  . Contrast Media [Iodinated Diagnostic Agents] Hives  . Beta Adrenergic Blockers Other (See Comments)    Low heart rate   . Ciprofloxacin   . Combigan [Brimonidine Tartrate-Timolol]   . Iodine   . Moxifloxacin     Other reaction(s): Dizziness (intolerance)  . Sulfa Antibiotics   . Tetracyclines & Related       Medication List       This list is accurate as of: 03/23/16 11:59 PM.  Always use your most recent med list.               acetaminophen 325 MG tablet  Commonly known as:  TYLENOL  Take 650 mg by mouth every 4 (four) hours as needed for mild pain or moderate pain.     albuterol (2.5 MG/3ML) 0.083% nebulizer solution  Commonly known as:  PROVENTIL  Take 2.5 mg by nebulization every 4 (four) hours as needed. For SOB     amLODipine  10 MG tablet  Commonly known as:  NORVASC  Take 10 mg by mouth daily.     bimatoprost 0.01 % Soln  Commonly known as:  LUMIGAN  Place 1 drop into both eyes at bedtime.     donepezil 5 MG tablet  Commonly known as:  ARICEPT  Take 5 mg by mouth at bedtime.     furosemide 20 MG tablet  Commonly known as:  LASIX  Take 20 mg by mouth daily.     levothyroxine 25 MCG tablet  Commonly known as:  SYNTHROID, LEVOTHROID  Take 50 mcg by mouth daily before breakfast.     MULTIVITAMIN & MINERAL PO  Take 1 tablet by mouth daily.     potassium chloride 10 MEQ tablet  Commonly known as:  K-DUR  Take 10 mEq by mouth daily.        Review of Systems This is limited secondary to dementia obtain from patient and nursing.  In general does not complain of  fever or chills.  Respiratory is not complaining of any increased shortness breath or cough.  Cardiac no chest pain edema Appears to be increasing  Musculoskeletal does not complain of joint pain.  Neurologic is not complaining of syncope or headaches.  Psych does have a history of dementia no behaviors have been noted  There is no immunization history on file for this patient. Pertinent  Health Maintenance Due  Topic Date Due  . DEXA SCAN  03/05/2017 (Originally 08/17/1993)  . PNA vac Low Risk Adult (1 of 2 - PCV13) 03/05/2017 (Originally 08/17/1993)  . INFLUENZA VACCINE  05/15/2016   No flowsheet data found. Functional Status Survey:    Filed Vitals:   03/23/16 1452  BP: 120/62  Pulse: 60  Resp: 16  Height: 5\' 5"  (1.651 m)  Weight: 117 lb (53.071 kg)   Body mass index is 19.47 kg/(m^2). Physical Exam In general this is a pleasant elderly female in no distress.  Her skin is warm and dry.  Heart is regular rate and rhythm without murmur gallop or rub.  Her legs are currently wrapped bilaterally however appears to have somewhat more increased edema than when I saw last time I would say somewhat in excess of one plus today.  Her chest is clear there is shallow air entry there is no labored breathing.  Abdomen is soft nontender positive bowel sounds.  Musculoskeletal does move all extremities 4 at baseline she does ambulate in a wheelchair.  Neurologic is grossly intact no lateralizing findings cranial nerves appear grossly intact her speech is clear.  Psych she is oriented to self pleasant but confused.   Labs reviewed:  03/19/2016.  Sodium 142 potassium 4.6 BUN 40 creatinine 1.45   03/08/2016.  Sodium 141 potassium 4.2 BUN 39 creatinine 1.37.  03/02/2016.  Sodium 144 potassium 4.4 BUN 53 creatinine 1.58.    Recent Labs  12/10/15 03/12/16  NA 145 141  K 4.0 4.2  BUN 34* 39*  CREATININE 1.0 1.4*    Recent Labs  12/06/15  AST 35  ALT 22    ALKPHOS 91    Recent Labs  12/10/15  WBC 8.3  HGB 11.1*  HCT 34*   No results found for: TSH No results found for: HGBA1C No results found for: CHOL, HDL, LDLCALC, LDLDIRECT, TRIG, CHOLHDL  Significant Diagnostic Results in last 30 days:  No results found.  Assessment/Plan   #1 history of chronic lower extremity edema-this appears to be progressing somewhat now that she  is off the Lasix-renal function still appears to be slightly elevated above her baseline-challenging situation but concerned about the increased edema the last few days will restart Lasix 20 mg a day for 2 days and then make it every other day--hopefully this will have a somewhat moderating affect t on her renal insufficiency. While controlling her edema  Also will give potassium 10 mEq a day on day she receives the Lasix.  We will update a basic metabolic panel on Monday, June 12.  Also will monitor her weights Monday Wednesday and Friday notify provider of gain greater than 3 pounds.  Clinically she appears to be at baseline blood pressure appears to be stable but this will have to be watched.  [id number] note greater than 25 minutes spent assessing patient reviewing her chart reviewing her labs discussing her status with nursing staff-and coordinating a plan of care of note 50% of time for name plan of care     Granville Lewis, PA-C [phone]

## 2016-03-28 LAB — BASIC METABOLIC PANEL
BUN: 35 mg/dL — AB (ref 4–21)
Creatinine: 1.7 mg/dL — AB (ref 0.5–1.1)
GLUCOSE: 121 mg/dL
POTASSIUM: 4.3 mmol/L (ref 3.4–5.3)
Sodium: 141 mmol/L (ref 137–147)

## 2016-03-29 ENCOUNTER — Non-Acute Institutional Stay (SKILLED_NURSING_FACILITY): Payer: Medicare Other | Admitting: Internal Medicine

## 2016-03-29 ENCOUNTER — Encounter: Payer: Self-pay | Admitting: Internal Medicine

## 2016-03-29 DIAGNOSIS — R609 Edema, unspecified: Secondary | ICD-10-CM | POA: Diagnosis not present

## 2016-03-29 DIAGNOSIS — N289 Disorder of kidney and ureter, unspecified: Secondary | ICD-10-CM

## 2016-03-29 DIAGNOSIS — M549 Dorsalgia, unspecified: Secondary | ICD-10-CM | POA: Diagnosis not present

## 2016-03-29 DIAGNOSIS — I10 Essential (primary) hypertension: Secondary | ICD-10-CM | POA: Diagnosis not present

## 2016-03-29 NOTE — Progress Notes (Signed)
Patient ID: Annette [name], female   DOB: June 15, 1926, 82 y.o.   MRN: [id number]    Location:      Place of Service:  SNF (31) Provider:  Granville Lewis  No primary care provider on file.  No care team member to display  Extended Emergency Contact Information Primary Emergency Contact: [facility] Address: [address] [place], [place] [address] [place] Phone: [phone] Mobile Phone: [phone] Relation: Son Secondary Emergency Contact: Sharene Butters Address: [address]          [place], [place] [address] [place] Phone: [phone] Mobile Phone: [phone] Relation: Relative  Code Status:  DNR Goals of care: Advanced Directive information Advanced Directives 03/23/2016  Does patient have an advance directive? Yes  Type of Advance Directive Out of facility DNR (pink MOST or yellow form)  Does patient want to make changes to advanced directive? No - Patient declined  Copy of advanced directive(s) in chart? Yes     Chief Complaint  Patient presents with  . Acute Visit  Follow-up lower extremity edema-renal sufficiency-Follow-up fall  HPI:  Pt is a 82 y.o. female seen today for an acute visit for Follow-up of edema complicated with renal insufficiency. As well as a fall sustained last night  Patient apparently was found sitting on her buttocks in her room last night-apparently there were no injuries or complaints of pain-however when I saw her today she was resting in bed and complaining of some lower back discomfort with movement.  In regards to any pain she does receive Tylenol patient assessment take anything stronger.  In regards to edema  She had been on Lasix 20 mg a day-previously we have held this  for concerns of elevated creatinine-her baseline appears to be around 1 rose to 1.58 in the middle of May and we held her Lasix-however she had developed some increased edema and weeping of her legs and did give her Lasix  consecutive days over the weekend and then reduced to every other day.  However updated labs shows creatinine has risen again up to 1.7.    Evaluation today the edema actually appears to be improving-.  Patient does have a history of bladder cancer and actually will be having a procedure done later this monthapparently   this involves the stent removal and replacement per discussion with her daughter-in-law   She does not complain of any increased shortness of breath vital signs appear to be stable. Most recent blood pressure 150/70 but appears to be variable we have been taking vital signs frequently since the fall and blood pressure systolically ranged from the 130s to 150--she is on Norvasc 10 mg a day          Past Medical History  Diagnosis Date  . Dementia without behavioral disturbance   . Bladder cancer metastasized to pelvic region [facility])   . COPD (chronic obstructive pulmonary disease) ([place])   . Hypothyroid   . Hypertension    Past Surgical History  Procedure Laterality Date  . Ureteral stent placement Right     2/2 bladder CA  . Vaginal tumor      removed in remote past  . Eye surgery      cataract    Allergies  Allergen Reactions  . Contrast Media [Iodinated Diagnostic Agents] Hives  . Beta Adrenergic Blockers Other (See Comments)    Low heart rate   . Ciprofloxacin   .  Combigan [Brimonidine Tartrate-Timolol]   . Iodine   . Moxifloxacin     Other reaction(s): Dizziness (intolerance)  . Sulfa Antibiotics   . Tetracyclines & Related       Medication List       This list is accurate as of: 03/29/16  8:09 PM.  Always use your most recent med list.               acetaminophen 325 MG tablet  Commonly known as:  TYLENOL  Take 650 mg by mouth every 4 (four) hours as needed for mild pain or moderate pain.     albuterol (2.5 MG/3ML) 0.083% nebulizer solution  Commonly known as:  PROVENTIL  Take 2.5 mg by nebulization every 4 (four) hours as needed.  For SOB     amLODipine 10 MG tablet  Commonly known as:  NORVASC  Take 10 mg by mouth daily.     bimatoprost 0.01 % Soln  Commonly known as:  LUMIGAN  Place 1 drop into both eyes at bedtime.     donepezil 5 MG tablet  Commonly known as:  ARICEPT  Take 5 mg by mouth at bedtime.     furosemide 20 MG tablet  Commonly known as:  LASIX  Take 20 mg by mouth daily.     levothyroxine 25 MCG tablet  Commonly known as:  SYNTHROID, LEVOTHROID  Take 50 mcg by mouth daily before breakfast.     MULTIVITAMIN & MINERAL PO  Take 1 tablet by mouth daily.     potassium chloride 10 MEQ tablet  Commonly known as:  K-DUR  Take 10 mEq by mouth daily.        Review of Systems This is limited secondary to dementia obtain from patient and nursing.As well as family member  In general does not complain of fever or chills.  Skin does not report increased bruising from baseline  Respiratory is not complaining of any increased shortness breath or cough.  Cardiac no chest pain edema Appears to need decreasing  Musculoskeletal does not complain of joint pain and is complaining of lower back discomfort with movement.  Neurologic is not complaining of syncope or headaches.  Psych does have a history of dementia no behaviors have been noted  There is no immunization history on file for this patient. Pertinent  Health Maintenance Due  Topic Date Due  . DEXA SCAN  03/05/2017 (Originally 08/17/1993)  . PNA vac Low Risk Adult (1 of 2 - PCV13) 03/05/2017 (Originally 08/17/1993)  . INFLUENZA VACCINE  05/15/2016   No flowsheet data found. Functional Status Survey:    Filed Vitals:   03/29/16 2004  BP: 150/70  Pulse: 60  Temp: 98.1 F (36.7 C)  Resp: 20   There is no weight on file to calculate BMI. Physical Exam In general this is a pleasant elderly female in no distress.  Her skin is warm and dry.  Heart is regular rate and rhythm without murmur gallop or rub..She has trace-bordering on  one plus edema lower extremity is bilaterally this appears improved . Previous exam  Her chest is clear there is shallow air entry there is no labored breathing.  Abdomen is soft nontender positive bowel sounds.  Musculoskeletal does move all extremities 4--there is some pain with palpation of her lower back I do not note any deformity-does not appear to have discomfort with flexion and extension at the hip or movement of legs  Neurologic is grossly intact no lateralizing findings cranial nerves  appear grossly intact her speech is clear.  Psych she is oriented to self pleasant but confused.   Labs reviewed:  March 26 2016.  Sodium 141 potassium 4.3 BUN 35 creatinine 1.7  03/19/2016.  Sodium 142 potassium 4.6 BUN 40 creatinine 1.45   03/08/2016.  Sodium 141 potassium 4.2 BUN 39 creatinine 1.37.  03/02/2016.  Sodium 144 potassium 4.4 BUN 53 creatinine 1.58.    Recent Labs  12/10/15 03/12/16  NA 145 141  K 4.0 4.2  BUN 34* 39*  CREATININE 1.0 1.4*    Recent Labs  12/06/15  AST 35  ALT 22  ALKPHOS 91    Recent Labs  12/10/15  WBC 8.3  HGB 11.1*  HCT 34*   No results found for: TSH No results found for: HGBA1C No results found for: CHOL, HDL, LDLCALC, LDLDIRECT, TRIG, CHOLHDL  Significant Diagnostic Results in last 30 days:  No results found.  Assessment/Plan   #1 history of chronic lower extremity edema-t This appears to have improved again-currently on Lasix every other day-however her creatinine is rising-will hold the Lasix and potassium supplementation for now-an update a metabolic panel Monday, June 19.  Clinically she appears stable in this regards.  #2 fall with no apparent injury-she is complaining of some lower back discomfort we will x-ray her lumbar and sacral spines as well as hips bilaterally and pelvis-he is on Tylenol as needed for pain she has expressed desires to not have anything stronger according to her daughter-in-law she has  always been resistant to stronger pain medicine this will have to be monitored however.  #3 hypertension actually blood pressures appear fairly well controlled most recently 150/70 all of this does not appear to be normal elevation some discomfort could be the cause of this-her pain will have to be monitored and Tylenol encouraged as needed if this is not effective we will have to revisit this.  [place], PA-C [phone]

## 2016-03-30 ENCOUNTER — Encounter: Payer: Self-pay | Admitting: Internal Medicine

## 2016-03-30 ENCOUNTER — Non-Acute Institutional Stay (SKILLED_NURSING_FACILITY): Payer: Medicare Other | Admitting: Internal Medicine

## 2016-03-30 DIAGNOSIS — L03115 Cellulitis of right lower limb: Secondary | ICD-10-CM

## 2016-03-30 DIAGNOSIS — L03116 Cellulitis of left lower limb: Secondary | ICD-10-CM

## 2016-03-30 NOTE — Progress Notes (Signed)
MRN: [id number] Name: Annette [name]  Sex: female Age: 82 y.o. DOB: [birth year]  [place] #:  Facility/Room:[facility] / [address] Level Of Care: SNF Provider: [name], MD Emergency Contacts: Extended Emergency Contact Information Primary Emergency Contact: [facility] Address: [address]          [place], [place] [address] [place] Phone: [phone] Mobile Phone: [phone] Relation: Son Secondary Emergency Contact: Sharene Butters Address: [place], [place] [address] [place] Phone: [phone] Mobile Phone: [phone] Relation: Relative  Code Status: DNR  Allergies: Contrast media; Beta adrenergic blockers; Ciprofloxacin; Combigan; Iodine; Moxifloxacin; Sulfa antibiotics; and Tetracyclines & related  Chief Complaint  Patient presents with  . Medical Management of Chronic Issues    Routine Visit    HPI: Patient is 82 y.o. female who nursing asked me to see for posssile cellulitis of legs. Pt is without fever or other systemic sx.  Past Medical History  Diagnosis Date  . Dementia without behavioral disturbance   . Bladder cancer metastasized to pelvic region [facility])   . COPD (chronic obstructive pulmonary disease) ([place])   . Hypothyroid   . Hypertension     Past Surgical History  Procedure Laterality Date  . Ureteral stent placement Right     2/2 bladder CA  . Vaginal tumor      removed in remote past  . Eye surgery      cataract      Medication List       This list is accurate as of: 03/30/16 11:59 PM.  Always use your most recent med list.               acetaminophen 325 MG tablet  Commonly known as:  TYLENOL  Take 650 mg by mouth every 4 (four) hours as needed for mild pain or moderate pain.     albuterol (2.5 MG/3ML) 0.083% nebulizer solution  Commonly known as:  PROVENTIL  Take 2.5 mg by nebulization every 4 (four) hours as needed for shortness of breath.     amLODipine 10 MG tablet   Commonly known as:  NORVASC  Take 10 mg by mouth daily.     bimatoprost 0.01 % Soln  Commonly known as:  LUMIGAN  Place 1 drop into both eyes at bedtime.     donepezil 5 MG tablet  Commonly known as:  ARICEPT  Take 5 mg by mouth daily.     levothyroxine 25 MCG tablet  Commonly known as:  SYNTHROID, LEVOTHROID  Take 50 mcg by mouth daily before breakfast.     MULTIVITAMIN & MINERAL PO  Take 1 tablet by mouth daily.     NUTRITIONAL SUPPLEMENT PO  Offer Magic Cup to lunch/dinner tray        Meds ordered this encounter  Medications  . albuterol (PROVENTIL) (2.5 MG/3ML) 0.083% nebulizer solution    Sig: Take 2.5 mg by nebulization every 4 (four) hours as needed for shortness of breath.  . Nutritional Supplements (NUTRITIONAL SUPPLEMENT PO)    Sig: Offer Magic Cup to [employer] History  Administered Date(s) Administered  . PPD Test 12/29/2015, 01/09/2016    Social History  Substance Use Topics  . Smoking status: Former Smoker -- 0.50 packs/day for 60 years    Types: Cigarettes  . Smokeless tobacco: Never Used  . Alcohol Use: No    Review of Systems  UTO 2/2  dementia   Filed Vitals:   03/30/16 1222  BP: 129/69  Pulse: 71  Temp: 98.2 F (36.8 C)  Resp: 18    Physical Exam  GENERAL APPEARANCE: Alert,  No acute distress  SKIN: BLE- multiple excoriations with small  wounds and scabs with surrounding skin reddened, with mild swelling and TTP HEENT: Unremarkable RESPIRATORY: Breathing is even, unlabored. Lung sounds are clear   CARDIOVASCULAR: Heart RRR no murmurs, rubs or gallops. No peripheral edema  GASTROINTESTINAL: Abdomen is soft, non-tender, not distended w/ normal bowel sounds.  GENITOURINARY: Bladder non tender, not distended  MUSCULOSKELETAL: No abnormal joints or musculature NEUROLOGIC: Cranial nerves 2-12 grossly intact. Moves all extremities PSYCHIATRIC: Mood and affect appropriate to situation, no behavioral  issues  Patient Active Problem List   Diagnosis Date Noted  . Renal insufficiency 04/05/2016  . Sepsis ([place]) 12/17/2015  . Pulmonary nodule, left 12/17/2015  . UTI (urinary tract infection) 12/17/2015  . Insomnia 12/17/2015  . Edema 05/02/2014  . HTN (hypertension) 05/02/2014  . AAA (abdominal aortic aneurysm) ([place]) 04/24/2014  . DDD (degenerative disc disease), lumbar 04/24/2014  . Encephalopathy, metabolic 99991111  . Hypertensive urgency 04/24/2014  . Dementia with behavioral disturbance   . Bladder cancer metastasized to pelvic region [facility])   . COPD (chronic obstructive pulmonary disease) ([place])   . Hypothyroid   . Hypertension   . Nicotine addiction 11/24/2013  . Lung nodule, multiple 10/01/2013    CBC    Component Value Date/Time   WBC 7.9 02/24/2016   HGB 10.9* 02/24/2016   HCT 35* 02/24/2016   PLT 192 02/24/2016    CMP     Component Value Date/Time   NA 141 03/12/2016   K 4.2 03/12/2016   BUN 39* 03/12/2016   CREATININE 1.4* 03/12/2016   AST 18 02/24/2016   ALT 11 02/24/2016   ALKPHOS 92 02/24/2016    Assessment and Plan  BLE CELLULITIS- will start doxycycline 100 mg BID fro 7 days;will have wound nurse start to follow.   Time spent > 25 min;> 50% of time with patient was spent reviewing records, labs, tests and studies, counseling and developing plan of care  [name], MD

## 2016-04-03 LAB — BASIC METABOLIC PANEL
BUN: 50 mg/dL — AB (ref 4–21)
Creatinine: 1.6 mg/dL — AB (ref 0.5–1.1)
Glucose: 136 mg/dL
Potassium: 4 mmol/L (ref 3.4–5.3)
SODIUM: 145 mmol/L (ref 137–147)

## 2016-04-05 ENCOUNTER — Encounter: Payer: Self-pay | Admitting: Internal Medicine

## 2016-04-05 ENCOUNTER — Non-Acute Institutional Stay (SKILLED_NURSING_FACILITY): Payer: Medicare Other | Admitting: Internal Medicine

## 2016-04-05 DIAGNOSIS — L03119 Cellulitis of unspecified part of limb: Secondary | ICD-10-CM

## 2016-04-05 DIAGNOSIS — N289 Disorder of kidney and ureter, unspecified: Secondary | ICD-10-CM | POA: Diagnosis not present

## 2016-04-05 DIAGNOSIS — R609 Edema, unspecified: Secondary | ICD-10-CM | POA: Diagnosis not present

## 2016-04-05 NOTE — Progress Notes (Signed)
Patient ID: Annette [name], female   DOB: 24-Feb-1926, 82 y.o.   MRN: [id number]     Location:      Place of Service:  SNF (31) Provider:  Granville Lewis  No primary care provider on file.  No care team member to display  Extended Emergency Contact Information Primary Emergency Contact: [facility] Address: [address] [place], [place] [address] [place] Phone: [phone] Mobile Phone: [phone] Relation: Son Secondary Emergency Contact: Sharene Butters Address: [address]          [place], [place] [address] [place] Phone: [phone] Mobile Phone: [phone] Relation: Relative  Code Status:  DNR Goals of care: Advanced Directive information Advanced Directives 03/30/2016  Does patient have an advance directive? Yes  Type of Advance Directive Out of facility DNR (pink MOST or yellow form)  Does patient want to make changes to advanced directive? No - Patient declined  Copy of advanced directive(s) in chart? Yes     Chief Complaint  Patient presents with  . Acute Visit  Follow-up lower extremity edema-renal sufficiency-Cellulitis  HPI:  Pt is a 82 y.o. female seen today for an acute visit for Follow-up of edema complicated with renal insufficiency. As well lower extremity cellulitis   r.  In regards to edema  She had been on Lasix 20 mg a day-previously we have held this  for concerns of elevated creatinine-her baseline appears to be around 1 rose to 1.58 in the middle of May and we held her Lasix-however she had developed some increased edema and weeping of her legs and did give her Lasix consecutive days over the weekend and then reduced to every other day.  However updated lab showed creatinine had risen again up to 1.7.--Socially we have held her Lasix and potassium.  Updated lab done earlier this week shows that creatinine has gone down somewhat to 1.59 with BUN of 50.  Edema appears to be increased somewhat from  previous exam but I suspect this may be dependent related since she does not have her legs elevated today she is in the wheelchair when I last evaluated her she was in bed.  This has complicated somewhat with a history of cellulitis of her lower extremities-Dr. [name] did see her last week for concerns of developing cellulitis lower extremities-and she is completing a one-week course of Keflex-according to nursing staff this appears to be improved she has some pale erythema but this is not warm or tender she also has some scaled areas bilaterally on her lower legs there is no active drainage.        Patient does have a history of bladder cancer and actually will be having a procedure done tomorrow   This  apparently involves the stent removal and replacement per discussion with her daughter-in-lawpreviously   She does not complain of any increased shortness of breath vital signs appear to be stable           Past Medical History  Diagnosis Date  . Dementia without behavioral disturbance   . Bladder cancer metastasized to pelvic region [facility])   . COPD (chronic obstructive pulmonary disease) ([place])   . Hypothyroid   . Hypertension    Past Surgical History  Procedure Laterality Date  . Ureteral stent placement Right     2/2 bladder CA  . Vaginal tumor      removed in remote past  . Eye surgery  cataract    Allergies  Allergen Reactions  . Contrast Media [Iodinated Diagnostic Agents] Hives  . Beta Adrenergic Blockers Other (See Comments)    Low heart rate   . Ciprofloxacin   . Combigan [Brimonidine Tartrate-Timolol]   . Iodine   . Moxifloxacin     Other reaction(s): Dizziness (intolerance)  . Sulfa Antibiotics   . Tetracyclines & Related       Medication List       This list is accurate as of: 04/05/16  5:56 PM.  Always use your most recent med list.               acetaminophen 325 MG tablet  Commonly known as:  TYLENOL  Take 650 mg by mouth every 4  (four) hours as needed for mild pain or moderate pain.     albuterol (2.5 MG/3ML) 0.083% nebulizer solution  Commonly known as:  PROVENTIL  Take 2.5 mg by nebulization every 4 (four) hours as needed for shortness of breath.     amLODipine 10 MG tablet  Commonly known as:  NORVASC  Take 10 mg by mouth daily.     bimatoprost 0.01 % Soln  Commonly known as:  LUMIGAN  Place 1 drop into both eyes at bedtime.     donepezil 5 MG tablet  Commonly known as:  ARICEPT  Take 5 mg by mouth daily.     levothyroxine 25 MCG tablet  Commonly known as:  SYNTHROID, LEVOTHROID  Take 50 mcg by mouth daily before breakfast.     MULTIVITAMIN & MINERAL PO  Take 1 tablet by mouth daily.     NUTRITIONAL SUPPLEMENT PO  Offer Magic Cup to lunch/dinner tray        Review of Systems This is limited secondary to dementia obtain from patient and nursing.  In general does not complain of fever or chills.  Skin does not report increased bruising from baseline--appears to have resolving cellulitis lower extremities does have a history of cellulitis here  Respiratory is not complaining of any increased shortness breath or cough.  Cardiac no chest pain --lower extremity edema as noted above  Musculoskeletal does not complain of joint pain and is complaining of lower back discomfort with movement. Per nursing x-rays have been negative for acute process  Neurologic is not complaining of syncope or headaches.  Psych does have a history of dementia no behaviors have been noted Immunization History  Administered Date(s) Administered  . PPD Test 12/29/2015, 01/09/2016   Pertinent  Health Maintenance Due  Topic Date Due  . DEXA SCAN  03/05/2017 (Originally 08/17/1993)  . PNA vac Low Risk Adult (1 of 2 - PCV13) 03/05/2017 (Originally 08/17/1993)  . INFLUENZA VACCINE  05/15/2016   No flowsheet data found. Functional Status Survey:    Filed Vitals:   04/05/16 1751  BP: 124/61  Pulse: 56  Temp: 97.6  F (36.4 C)  Resp: 16   There is no weight on file to calculate BMI. Physical Exam In general this is a pleasant elderly female in no distress.  Her skin is warm and dry.Legs bilaterally there is pale erythema this is cool to touch and nontender there are also mid shins scaled areas bilaterally-I do not see active drainage or warmth or tenderness  Heart is regular rate and rhythm without murmur gallop or rub..She has one plus edema bilaterally this is more so on the right pedal pulses are somewhat reduced bilaterally  Her chest is clear there is shallow air  entry there is no labored breathing.  Abdomen is soft nontender positive bowel sounds.  Musculoskeletal does move all extremities 4--there is some pain with palpation of her lower back I do not note any deformity-does not appear to have discomfort with flexion and extension at the hip or movement of legs  Neurologic is grossly intact no lateralizing findings cranial nerves appear grossly intact her speech is clear.  Psych she is oriented to self pleasant but confused. Which is her baseline   Labs reviewed:  March 26 2016.  Sodium 141 potassium 4.3 BUN 35 creatinine 1.7  03/19/2016.  Sodium 142 potassium 4.6 BUN 40 creatinine 1.45   03/08/2016.  Sodium 141 potassium 4.2 BUN 39 creatinine 1.37.  03/02/2016.  Sodium 144 potassium 4.4 BUN 53 creatinine 1.58.    Recent Labs  02/24/16 03/05/16 03/12/16  NA 142 144 141  K 4.2 4.4 4.2  BUN 45* 53* 39*  CREATININE 1.2* 1.6* 1.4*    Recent Labs  12/06/15 02/24/16  AST 35 18  ALT 22 11  ALKPHOS 91 92    Recent Labs  12/10/15 02/24/16  WBC 8.3 7.9  HGB 11.1* 10.9*  HCT 34* 35*  PLT  --  192   Lab Results  Component Value Date   TSH 2.48 02/24/2016   Lab Results  Component Value Date   HGBA1C 5.5 02/24/2016   Lab Results  Component Value Date   CHOL 177 02/24/2016   HDL 67 02/24/2016   LDLCALC 91 02/24/2016   TRIG 97 02/24/2016    Significant  Diagnostic Results in last 30 days:  No results found.  Assessment/Plan   #1 history of chronic lower extremity edema-t  This continues to be somewhat variable-she is currently off Lasix secondary to renal insufficiency issues edema appears somewhat increased from previous exam as she has her legs in a dependent position this evening she had them elevated when I saw her last time-since patient is going out for procedure tomorrow would be hesitant to  make medication changes since clinically she appears to be stable but this will have to be followed.   #2 In regards to renal insufficiency creatinine is slightly improved but still elevated above her baseline at 1.59 will update a metabolic panel next week and continue to monitor her clinical status and edema   #3cellulitis she is completing a seven-day course of antibiotic this has improved per nursing--the residual erythema is ple nontender and not significantly warm--have to be monitored for changes  [id number]      [place]an[place] Lewis, PA-C [phone]

## 2016-04-11 LAB — BASIC METABOLIC PANEL
BUN: 34 mg/dL — AB (ref 4–21)
CREATININE: 1.2 mg/dL — AB (ref 0.5–1.1)
GLUCOSE: 105 mg/dL
POTASSIUM: 3.8 mmol/L (ref 3.4–5.3)
SODIUM: 142 mmol/L (ref 137–147)

## 2016-05-10 ENCOUNTER — Encounter: Payer: Self-pay | Admitting: Internal Medicine

## 2016-05-10 ENCOUNTER — Non-Acute Institutional Stay (SKILLED_NURSING_FACILITY): Payer: Medicare Other | Admitting: Internal Medicine

## 2016-05-10 DIAGNOSIS — E038 Other specified hypothyroidism: Secondary | ICD-10-CM | POA: Diagnosis not present

## 2016-05-10 DIAGNOSIS — F0390 Unspecified dementia without behavioral disturbance: Secondary | ICD-10-CM

## 2016-05-10 DIAGNOSIS — C679 Malignant neoplasm of bladder, unspecified: Secondary | ICD-10-CM | POA: Diagnosis not present

## 2016-05-10 DIAGNOSIS — N289 Disorder of kidney and ureter, unspecified: Secondary | ICD-10-CM

## 2016-05-10 DIAGNOSIS — I10 Essential (primary) hypertension: Secondary | ICD-10-CM

## 2016-05-10 DIAGNOSIS — C7989 Secondary malignant neoplasm of other specified sites: Secondary | ICD-10-CM

## 2016-05-10 DIAGNOSIS — J42 Unspecified chronic bronchitis: Secondary | ICD-10-CM | POA: Diagnosis not present

## 2016-05-10 NOTE — Progress Notes (Signed)
Location:   [place] Room Number: 419/D Place of Service:  SNF [phone]) Provider:  [place]  No primary care provider on file.  No care team member to display  Extended Emergency Contact Information Primary Emergency Contact: [facility] Address: [address] [place], [place] [address] [place] Phone: [phone] Mobile Phone: [phone] Relation: Son Secondary Emergency Contact: Sharene Butters Address: [place]          [place], [place] [address] [place] Phone: [phone] Mobile Phone: [phone] Relation: Relative  Code Status:  DNR Goals of care: Advanced Directive information Advanced Directives 05/10/2016  Does patient have an advance directive? Yes  Type of Advance Directive Out of facility DNR (pink MOST or yellow form)  Does patient want to make changes to advanced directive? No - Patient declined  Copy of advanced directive(s) in chart? Yes  Would patient like information on creating an advanced directive? -     Chief Complaint  Patient presents with  . Medical Management of Chronic Issues    Routine visit   For medical management of chronic medical issues including dementia-COPD-hypertension-hypothyroidism-renal insufficiency-history of bladder carcinoma with metastasis to pelvic region.   HPI:  Pt is a 82 y.o. female seen today for medical management of chronic diseases.  As noted above-per nursing she appears to be stable. She does have a history of bladder cancer which is followed at [facility] Approximately a month ago she did go to urology at [facility] for stent placement apparently has  every 6 months approximately.  Tolerated procedure apparently without any difficulty.  We've also at times given her Lasix for history of lower extremity edema which appears to have stabilized somewhat she still has extremity right  greater than left-currently not on Lasix we were concerned about her renal function creatinine appear to be rising but this appears to be now down close to her baseline at 1.2 on most recent lab we will update this as well.  Nursing staff does not report any acute issues she has had some history of recurrent cellulitis of the lower extremities and has completed antibiotics for this.  She has no complaints today is sitting in her wheelchair comfortably her other medical issues appear to be stable.     Past Medical History:  Diagnosis Date  . Bladder cancer metastasized to pelvic region [facility])   . COPD (chronic obstructive pulmonary disease) ([place])   . Dementia without behavioral disturbance   . Hypertension   . Hypothyroid    Past Surgical History:  Procedure Laterality Date  . EYE SURGERY     cataract  . URETERAL STENT PLACEMENT Right    2/2 bladder CA  . vaginal tumor     removed in remote past    Allergies  Allergen Reactions  . Contrast Media [Iodinated Diagnostic Agents] Hives  . Beta Adrenergic Blockers Other (See Comments)    Low heart rate   . Ciprofloxacin   . Combigan [Brimonidine Tartrate-Timolol]   . Iodine   . Moxifloxacin     Other reaction(s): Dizziness (intolerance)  . Sulfa Antibiotics   . Tetracyclines & Related       Medication List       Accurate as of 05/10/16 12:02 PM. Always use your most recent med list.          acetaminophen 325 MG tablet  Commonly known as:  TYLENOL Give 1-2 tablets by mouth every six hours as needed for mild to moderate pain or fever   albuterol (2.5 MG/3ML) 0.083% nebulizer solution Commonly known as:  PROVENTIL Take 2.5 mg by nebulization every 4 (four) hours as needed for shortness of breath.   amLODipine 10 MG tablet Commonly known as:  NORVASC Take 10 mg by mouth daily.   bimatoprost 0.01 % Soln Commonly known as:  LUMIGAN Place 1 drop into both eyes at bedtime.   docusate sodium 100 MG capsule Commonly  known as:  COLACE Take 100 mg by mouth 2 (two) times daily.   donepezil 5 MG tablet Commonly known as:  ARICEPT Take 5 mg by mouth daily.   levothyroxine 25 MCG tablet Commonly known as:  SYNTHROID, LEVOTHROID Take 50 mcg by mouth daily before breakfast.   NUTRITIONAL SUPPLEMENT PO Offer Magic Cup to lunch/dinner tray       Review of Systems   This is limited secondary to dementia no acute issues reported by nursing-when asked she is not complaining of any chest pain or shortness of breath Ror leg discomfort  Immunization History  Administered Date(s) Administered  . PPD Test 12/29/2015, 01/09/2016   Pertinent  Health Maintenance Due  Topic Date Due  . DEXA SCAN  03/05/2017 (Originally 08/17/1993)  . PNA vac Low Risk Adult (1 of 2 - PCV13) 03/05/2017 (Originally 08/17/1993)  . INFLUENZA VACCINE  05/15/2016   No flowsheet data found. Functional Status Survey:    Vitals:   05/10/16 1148  BP: 111/60  Pulse: 70  Resp: 20  Temp: 98 F (36.7 C)  TempSrc: Oral  Weight: 121 lb (54.9 kg)   Body mass index is 20.14 kg/m. Physical Exam    In general this is a pleasant elderly female in no distress.  Her skin is warm and dry.Legs bilaterally there is pale erythema shin areas bilaterally this is not significantly warm to touch or tender at this point does not appear cellulitic  Eyes she does have some slight left eye proptosis visual acuity appears in os intact sclera and conjunctiva are clear visual acuity appears grossly intact.  Oropharynx is clear mucous membranes moist.    Heart is regular rate and rhythm without murmur gallop or rub..She has one plus edema  on the right lower extremity somewhat less on the left t pedal pulses are somewhat reduced bilaterally  Her chest is clear there is shallow air entry there is no labored breathing.  Abdomen is soft nontender positive bowel sounds.  Musculoskeletal does move all extremities 4-- With some baseline  lower extremity weakness which is not new-do not note any deformities other than arthritic  Neurologic is grossly intact no lateralizing findings cranial nerves appear grossly intact her speech is clear.  Psych she is oriented to self pleasant but confused. Which is her baseline  Labs reviewed:  Recent Labs  03/05/16 03/12/16 04/11/16  NA 144 141 142  K 4.4 4.2 3.8  BUN 53* 39* 34*  CREATININE 1.6* 1.4* 1.2*    Recent Labs  12/06/15 02/24/16  AST 35 18  ALT 22 11  ALKPHOS 91 92    Recent Labs  12/10/15 02/24/16  WBC 8.3 7.9  HGB 11.1* 10.9*  HCT 34* 35*  PLT  --  192   Lab Results  Component Value Date   TSH 2.48 02/24/2016   Lab Results  Component Value Date   HGBA1C 5.5 02/24/2016   Lab Results  Component  Value Date   CHOL 177 02/24/2016   HDL 67 02/24/2016   LDLCALC 91 02/24/2016   TRIG 97 02/24/2016    Significant Diagnostic Results in last 30 days:  No results found.  Assessment/Plan  #1-history of dementia this appears stable no behaviors have been noted continues with confusion but does well with supportive care she is on low-dose Aricept.  #2 history of renal insufficiency complicated with history of edema-this is been somewhat of a balancing situation between the edema in her renal issues-creatinine appears relatively baseline at 1.20 most recent lab we will update this she is not currently on Lasix her edema will have to be monitored.  She is not complaining of any shortness of breath or discomfort.  #3 history of hypertension this appears stable on Norvasc 5 mg a day.  #4 history of COPD this is been stable for some time she is on when necessary nebulizers.  #5-history of bladder cancer with metastasis this is managed again at [facility] she has had previous stent placement and tolerated this well does not appear to be overtly symptomatic at this point monitor.  #6 history hypothyroidism continues on Synthroid TSH  was 2.48 on lab done on 02/24/2016 at this point continue current dose secondary to debility.  #7 history of lower extremity cellulitis at this point monitor the erythema on her legs I suspect this is relatively baseline venous stasis from appearance today but will have to be watched with her history.  Number 8I do note patient does have a listed history of insomnia but she is not complaining of this recently trazodone actually was discontinued she does not appear to be suffering any effects from its discontinuance.  #9 edema-we have done venous Dopplers of both extremities previously which have been negative for any DVT-again this will have to be monitored since she is off the Lasix encourage leg elevation as much as possible.  Anemia will update a CBC-last hemoglobin was 10.9 back in Feb 24 2016  [id number]-of note greater than 35 minutes spent assessing patient reviewing her labs reviewing her chart discussing her status with nursing staff and coordinating and formulating a plan of care for numerous diagnoses-of note greater than 50% of time spent coordinating plan of care

## 2016-05-16 LAB — CBC AND DIFFERENTIAL
HCT: 13 % — AB (ref 36–46)
Hemoglobin: 5.1 g/dL — AB (ref 12.0–16.0)
PLATELETS: 160 10*3/uL (ref 150–399)
WBC: 8.4 10*3/mL

## 2016-05-16 LAB — BASIC METABOLIC PANEL WITH GFR
BUN: 36 mg/dL — AB (ref 4–21)
Creatinine: 1.5 mg/dL — AB (ref 0.5–1.1)
Glucose: 144 mg/dL
Potassium: 4.5 mmol/L (ref 3.4–5.3)
Sodium: 141 mmol/L (ref 137–147)

## 2016-06-01 LAB — BASIC METABOLIC PANEL
BUN: 36 mg/dL — AB (ref 4–21)
CREATININE: 1.2 mg/dL — AB (ref 0.5–1.1)
Glucose: 158 mg/dL
Potassium: 4.6 mmol/L (ref 3.4–5.3)
Sodium: 140 mmol/L (ref 137–147)

## 2016-06-11 ENCOUNTER — Non-Acute Institutional Stay (SKILLED_NURSING_FACILITY): Payer: Medicare Other | Admitting: Internal Medicine

## 2016-06-11 ENCOUNTER — Encounter: Payer: Self-pay | Admitting: Internal Medicine

## 2016-06-11 DIAGNOSIS — E034 Atrophy of thyroid (acquired): Secondary | ICD-10-CM | POA: Diagnosis not present

## 2016-06-11 DIAGNOSIS — R911 Solitary pulmonary nodule: Secondary | ICD-10-CM | POA: Diagnosis not present

## 2016-06-11 DIAGNOSIS — E038 Other specified hypothyroidism: Secondary | ICD-10-CM

## 2016-06-11 DIAGNOSIS — F03918 Unspecified dementia, unspecified severity, with other behavioral disturbance: Secondary | ICD-10-CM

## 2016-06-11 DIAGNOSIS — F0391 Unspecified dementia with behavioral disturbance: Secondary | ICD-10-CM | POA: Diagnosis not present

## 2016-06-11 NOTE — Assessment & Plan Note (Signed)
Most recent  Note from [place] on 7/28; high likelihood of malignancy; needs bx; family is speaking to urology about pt's bladder CA before making a decision; will f/u on what ,if any ,decision has been made

## 2016-06-11 NOTE — Progress Notes (Signed)
MRN: [id number] Name: Annette [name]  Sex: female Age: 82 y.o. DOB: 11/02/1925  Evan #:  Facility/Room: [place] / 419 D Level Of Care: SNF Provider: [name], MD Emergency Contacts: Extended Emergency Contact Information Primary Emergency Contact: [facility] Address: [address]          [place], [place] [address] [place] of [place] Phone: [phone] Mobile Phone: [phone] Relation: Son Secondary Emergency Contact: Sharene Butters Address: [place]          [place], [place] [address] [place] Phone: [phone] Mobile Phone: [phone] Relation: Relative  Code Status: DNR  Allergies: Contrast media [iodinated diagnostic agents]; Beta adrenergic blockers; Ciprofloxacin; Combigan [brimonidine tartrate-timolol]; Iodine; Moxifloxacin; Sulfa antibiotics; and Tetracyclines & related  Chief Complaint  Patient presents with  . Medical Management of Chronic Issues    Routine Visit    HPI: Patient is 82 y.o. female with bladder CA who is being seen for routine issues of hypothyroidism, dementia with behavoirs and LUL nodule suspicious for CA.  Past Medical History:  Diagnosis Date  . Bladder cancer metastasized to pelvic region [facility])   . COPD (chronic obstructive pulmonary disease) ([place])   . Dementia without behavioral disturbance   . Hypertension   . Hypothyroid     Past Surgical History:  Procedure Laterality Date  . EYE SURGERY     cataract  . URETERAL STENT PLACEMENT Right    2/2 bladder CA  . vaginal tumor     removed in remote past      Medication List       Accurate as of 06/11/16  6:57 PM. Always use your most recent med list.          acetaminophen 325 MG tablet Commonly known as:  TYLENOL Give 1-2 tablets by mouth every six hours as needed for mild to moderate pain or fever   albuterol (2.5 MG/3ML) 0.083% nebulizer solution Commonly known as:  PROVENTIL Take 2.5 mg by nebulization every 4 (four) hours as  needed for shortness of breath.   amLODipine 10 MG tablet Commonly known as:  NORVASC Take 10 mg by mouth daily.   bimatoprost 0.01 % Soln Commonly known as:  LUMIGAN Place 1 drop into both eyes at bedtime.   docusate sodium 100 MG capsule Commonly known as:  COLACE Take 100 mg by mouth 2 (two) times daily.   donepezil 5 MG tablet Commonly known as:  ARICEPT Take 5 mg by mouth daily.   levothyroxine 50 MCG tablet Commonly known as:  SYNTHROID, LEVOTHROID Take 50 mcg by mouth daily before breakfast.   NUTRITIONAL SUPPLEMENT PO Offer Magic Cup to lunch/dinner tray   ENSURE ENLIVE PO Take 237 mLs by mouth 4 (four) times daily -  before meals and at bedtime.       No orders of the defined types were placed in this encounter.   Immunization History  Administered Date(s) Administered  . PPD Test 12/29/2015, 01/09/2016    Social History  Substance Use Topics  . Smoking status: Former Smoker    Packs/day: 0.50    Years: 60.00    Types: Cigarettes  . Smokeless tobacco: Never Used  . Alcohol use No    Review of Systems  UTO 2/2 dementia   Vitals:   06/11/16 0818  BP: 115/68  Pulse: (!) 56  Resp: 20  Temp: 97.2 F (36.2 C)    Physical Exam  GENERAL APPEARANCE: Alert, min conversant, No acute distress  SKIN: No diaphoresis rash HEENT: Unremarkable RESPIRATORY: Breathing is even, unlabored. Lung sounds are clear   CARDIOVASCULAR: Heart RRR no murmurs, rubs or gallops. No peripheral edema  GASTROINTESTINAL: Abdomen is soft, non-tender, not distended w/ normal bowel sounds.  GENITOURINARY: Bladder non tender, not distended  MUSCULOSKELETAL: No abnormal joints or musculature NEUROLOGIC: Cranial nerves 2-12 grossly intact. Moves all extremities PSYCHIATRIC: Mood and affect appropriate to situation with dementia, no behavioral issues  Patient Active Problem List   Diagnosis Date Noted  . Renal insufficiency 04/05/2016  . Sepsis ([place]) 12/17/2015  . Pulmonary  nodule, left 12/17/2015  . UTI (urinary tract infection) 12/17/2015  . Insomnia 12/17/2015  . Edema 05/02/2014  . HTN (hypertension) 05/02/2014  . AAA (abdominal aortic aneurysm) ([place]) 04/24/2014  . DDD (degenerative disc disease), lumbar 04/24/2014  . Encephalopathy, metabolic 99991111  . Hypertensive urgency 04/24/2014  . Dementia with behavioral disturbance   . Bladder cancer metastasized to pelvic region [facility])   . COPD (chronic obstructive pulmonary disease) ([place])   . Hypothyroid   . Hypertension   . Nicotine addiction 11/24/2013  . Lung nodule, multiple 10/01/2013    CBC    Component Value Date/Time   WBC 8.4 05/16/2016   HGB 5.1 (A) 05/16/2016   HCT 13 (A) 05/16/2016   PLT 160 05/16/2016    CMP     Component Value Date/Time   NA 141 05/16/2016   K 4.5 05/16/2016   BUN 36 (A) 05/16/2016   CREATININE 1.5 (A) 05/16/2016   AST 18 02/24/2016   ALT 11 02/24/2016   ALKPHOS 92 02/24/2016    Assessment and Plan  Hypothyroid TSH in 02/2016 was 2.48;controlled on synthroid 50 mcg daily ;plan to cont  Dementia with behavioral disturbance Pt noted stable by Psych on current aricept 5 mg daily; nursing without c/o   Pulmonary nodule, left Most recent  Note from [place] on 7/28; high likelihood of malignancy; needs bx; family is speaking to urology about pt's bladder CA before making a decision; will f/u on what ,if any ,decision has been made   Webb Silversmith D. [name], MD

## 2016-06-11 NOTE — Assessment & Plan Note (Signed)
TSH in 02/2016 was 2.48;controlled on synthroid 50 mcg daily ;plan to cont

## 2016-06-11 NOTE — Assessment & Plan Note (Signed)
Pt noted stable by Psych on current aricept 5 mg daily; nursing without c/o

## 2016-06-15 LAB — BASIC METABOLIC PANEL
BUN: 38 mg/dL — AB (ref 4–21)
Creatinine: 1.4 mg/dL — AB (ref 0.5–1.1)
Glucose: 102 mg/dL
POTASSIUM: 4 mmol/L (ref 3.4–5.3)
SODIUM: 142 mmol/L (ref 137–147)

## 2016-07-10 ENCOUNTER — Encounter: Payer: Self-pay | Admitting: Internal Medicine

## 2016-07-10 ENCOUNTER — Non-Acute Institutional Stay (SKILLED_NURSING_FACILITY): Payer: Medicare Other | Admitting: Internal Medicine

## 2016-07-10 DIAGNOSIS — K5904 Chronic idiopathic constipation: Secondary | ICD-10-CM | POA: Diagnosis not present

## 2016-07-10 DIAGNOSIS — I10 Essential (primary) hypertension: Secondary | ICD-10-CM

## 2016-07-10 DIAGNOSIS — J42 Unspecified chronic bronchitis: Secondary | ICD-10-CM

## 2016-07-10 NOTE — Progress Notes (Signed)
Location:  [place] Room Number: [id number] Place of Service:  SNF (17)  Annette [name]. [name], MD  No care team member to display  Extended Emergency Contact Information Primary Emergency Contact: [facility] Address: [address]          [place], [place] [address] [place] of [place] Phone: [phone] Mobile Phone: [phone] Relation: Son Secondary Emergency Contact: Sharene Butters Address: [place]          [place], [place] [address] [place] Phone: [phone] Mobile Phone: [phone] Relation: Relative    Allergies: Contrast media [iodinated diagnostic agents]; Beta adrenergic blockers; Ciprofloxacin; Combigan [brimonidine tartrate-timolol]; Iodine; Moxifloxacin; Sulfa antibiotics; and Tetracyclines & related  Chief Complaint  Patient presents with  . Medical Management of Chronic Issues    Routine Visit    HPI: Patient is 82 y.o. female who is being seen for routine issues of HTN, COPD and constipation.  Past Medical History:  Diagnosis Date  . Bladder cancer metastasized to pelvic region [facility])   . COPD (chronic obstructive pulmonary disease) ([place])   . Dementia without behavioral disturbance   . Hypertension   . Hypothyroid     Past Surgical History:  Procedure Laterality Date  . EYE SURGERY     cataract  . URETERAL STENT PLACEMENT Right    2/2 bladder CA  . vaginal tumor     removed in remote past      Medication List       Accurate as of 07/10/16 11:59 PM. Always use your most recent med list.          acetaminophen 325 MG tablet Commonly known as:  TYLENOL Take 650 mg by mouth every 6 (six) hours as needed for mild pain, moderate pain or fever.   albuterol (2.5 MG/3ML) 0.083% nebulizer solution Commonly known as:  PROVENTIL Take 2.5 mg by nebulization every 4 (four) hours as needed for shortness of breath.   amLODipine 10 MG tablet Commonly known as:  NORVASC Take 10 mg by mouth  daily.   bimatoprost 0.01 % Soln Commonly known as:  LUMIGAN Place 1 drop into both eyes at bedtime.   docusate sodium 100 MG capsule Commonly known as:  COLACE Take 100 mg by mouth 2 (two) times daily.   donepezil 5 MG tablet Commonly known as:  ARICEPT Take 5 mg by mouth daily.   levothyroxine 50 MCG tablet Commonly known as:  SYNTHROID, LEVOTHROID Take 50 mcg by mouth daily before breakfast.   NUTRITIONAL SUPPLEMENT PO Offer Magic Cup to lunch/dinner tray       No orders of the defined types were placed in this encounter.   Immunization History  Administered Date(s) Administered  . PPD Test 12/29/2015, 01/09/2016    Social History  Substance Use Topics  . Smoking status: Former Smoker    Packs/day: 0.50    Years: 60.00    Types: Cigarettes  . Smokeless tobacco: Never Used  . Alcohol use No    Review of Systems  UTO 2/2 dementia    Vitals:   07/10/16 1032  BP: 115/68  Pulse: (!) 58  Resp: 18  Temp: 97.2 F (36.2 C)   Body mass index is 19.6 kg/m. Physical Exam  GENERAL APPEARANCE: Alert, min conversant, No acute distress  SKIN: No diaphoresis rash HEENT: Unremarkable RESPIRATORY: Breathing is even, unlabored. Lung sounds are clear   CARDIOVASCULAR: Heart RRR no murmurs, rubs or gallops. No peripheral edema  GASTROINTESTINAL: Abdomen is soft, non-tender, not distended w/ normal bowel sounds.  GENITOURINARY: Bladder non tender, not distended  MUSCULOSKELETAL: No abnormal joints or musculature NEUROLOGIC: Cranial nerves 2-12 grossly intact. Moves all extremities PSYCHIATRIC: Mood and affect appropriate to situation with dementia, no behavioral issues  Patient Active Problem List   Diagnosis Date Noted  . Constipation 08/02/2016  . Renal insufficiency 04/05/2016  . Sepsis ([place]) 12/17/2015  . Pulmonary nodule, left 12/17/2015  . UTI (urinary tract infection) 12/17/2015  . Insomnia 12/17/2015  . Edema 05/02/2014  . HTN (hypertension)  05/02/2014  . AAA (abdominal aortic aneurysm) ([place]) 04/24/2014  . DDD (degenerative disc disease), lumbar 04/24/2014  . Encephalopathy, metabolic 99991111  . Hypertensive urgency 04/24/2014  . Dementia with behavioral disturbance   . Bladder cancer metastasized to pelvic region [facility])   . COPD (chronic obstructive pulmonary disease) ([place])   . Hypothyroid   . Hypertension   . Nicotine addiction 11/24/2013  . Lung nodule, multiple 10/01/2013    CMP     Component Value Date/Time   NA 142 06/15/2016   K 4.0 06/15/2016   BUN 38 (A) 06/15/2016   CREATININE 1.4 (A) 06/15/2016   AST 18 02/24/2016   ALT 11 02/24/2016   ALKPHOS 92 02/24/2016    Recent Labs  05/16/16 06/01/16 06/15/16  NA 141 140 142  K 4.5 4.6 4.0  BUN 36* 36* 38*  CREATININE 1.5* 1.2* 1.4*    Recent Labs  12/06/15 02/24/16  AST 35 18  ALT 22 11  ALKPHOS 91 92    Recent Labs  12/10/15 02/24/16 05/16/16  WBC 8.3 7.9 8.4  HGB 11.1* 10.9* 5.1*  HCT 34* 35* 13*  PLT  --  192 160    Recent Labs  02/24/16  CHOL 177  LDLCALC 91  TRIG 97   No results found for: [facility] Lab Results  Component Value Date   TSH 2.48 02/24/2016   Lab Results  Component Value Date   HGBA1C 5.5 02/24/2016   Lab Results  Component Value Date   CHOL 177 02/24/2016   HDL 67 02/24/2016   LDLCALC 91 02/24/2016   TRIG 97 02/24/2016    Significant Diagnostic Results in last 30 days:  No results found.  Assessment and Plan  HTN (hypertension) Controlled; cont Norvasc 10 mg daily  COPD (chronic obstructive pulmonary disease) Controlled; does not use prn nebs;plan to cont alb nebs prn  Constipation Controlled ; cont colace 100 mg BID     Annette [name]. [name], MD

## 2016-08-02 ENCOUNTER — Encounter: Payer: Self-pay | Admitting: Internal Medicine

## 2016-08-02 ENCOUNTER — Non-Acute Institutional Stay (SKILLED_NURSING_FACILITY): Payer: Medicare Other | Admitting: Internal Medicine

## 2016-08-02 DIAGNOSIS — R0902 Hypoxemia: Secondary | ICD-10-CM | POA: Diagnosis not present

## 2016-08-02 DIAGNOSIS — J189 Pneumonia, unspecified organism: Secondary | ICD-10-CM | POA: Diagnosis not present

## 2016-08-02 DIAGNOSIS — K5900 Constipation, unspecified: Secondary | ICD-10-CM | POA: Insufficient documentation

## 2016-08-02 NOTE — Assessment & Plan Note (Signed)
Controlled ; cont colace 100 mg BID

## 2016-08-02 NOTE — Progress Notes (Signed)
Location:  [place] Room Number: [id number] Place of Service:  SNF (31)  [name], MD  No care team member to display  Extended Emergency Contact Information Primary Emergency Contact: [facility] Address: [address]          [place], [place] [address] Johnnette Litter of [place] Phone: [phone] Mobile Phone: [phone] Relation: Son Secondary Emergency Contact: Sharene Butters Address: [address]          [place], [place] [address] [place] Phone: [phone] Mobile Phone: [phone] Relation: Relative    Allergies: Contrast media [iodinated diagnostic agents]; Beta adrenergic blockers; Ciprofloxacin; Combigan [brimonidine tartrate-timolol]; Iodine; Moxifloxacin; Sulfa antibiotics; and Tetracyclines & related  Chief Complaint  Patient presents with  . Acute Visit    Acute    HPI: Patient is 82 y.o. female who nursing asked me to see for cough onset today. Pt also vomited once but it was not stomach contents it was mucous.Pt's VS were stABLE, O2SAT RA WAS 92%. Pt was OK yesterday , ate and drank fine.  Past Medical History:  Diagnosis Date  . Bladder cancer metastasized to pelvic region [facility])   . COPD (chronic obstructive pulmonary disease) ([place])   . Dementia without behavioral disturbance   . Hypertension   . Hypothyroid     Past Surgical History:  Procedure Laterality Date  . EYE SURGERY     cataract  . URETERAL STENT PLACEMENT Right    2/2 bladder CA  . vaginal tumor     removed in remote past      Medication List       Accurate as of 08/02/16 10:54 AM. Always use your most recent med list.          acetaminophen 325 MG tablet Commonly known as:  TYLENOL Take 650 mg by mouth every 6 (six) hours as needed for mild pain, moderate pain or fever.   albuterol (2.5 MG/3ML) 0.083% nebulizer solution Commonly known as:  PROVENTIL Take 2.5 mg by nebulization every 4 (four) hours as needed for  shortness of breath.   amLODipine 10 MG tablet Commonly known as:  NORVASC Take 10 mg by mouth daily.   bimatoprost 0.01 % Soln Commonly known as:  LUMIGAN Place 1 drop into both eyes at bedtime.   docusate sodium 100 MG capsule Commonly known as:  COLACE Take 100 mg by mouth 2 (two) times daily.   donepezil 5 MG tablet Commonly known as:  ARICEPT Take 5 mg by mouth daily.   levothyroxine 50 MCG tablet Commonly known as:  SYNTHROID, LEVOTHROID Take 50 mcg by mouth daily before breakfast.   NUTRITIONAL SUPPLEMENT PO Offer Magic Cup to lunch/dinner tray       No orders of the defined types were placed in this encounter.   Immunization History  Administered Date(s) Administered  . PPD Test 12/29/2015, 01/09/2016    Social History  Substance Use Topics  . Smoking status: Former Smoker    Packs/day: 0.50    Years: 60.00    Types: Cigarettes  . Smokeless tobacco: Never Used  . Alcohol use No    Review of Systems  UTO 2/2 dementia    Vitals:   08/02/16 1048  BP: 131/70  Pulse: 62  Resp: 18  Temp: 97 F (36.1 C)   Body mass index is 19.74 kg/m. Physical Exam  GENERAL APPEARANCE: Alert, mod conversant, No acute distress  SKIN: No diaphoresis rash HEENT: Unremarkable RESPIRATORY:  Breathing is even, unlabored. Lung sounds are rales RUL; O2 sat while up and being washed is 85% RA  CARDIOVASCULAR: Heart RRR no murmurs, rubs or gallops. No peripheral edema  GASTROINTESTINAL: Abdomen is soft, non-tender, not distended w/ normal bowel sounds.  GENITOURINARY: Bladder non tender, not distended  MUSCULOSKELETAL: No abnormal joints or musculature NEUROLOGIC: Cranial nerves 2-12 grossly intact. Moves all extremities PSYCHIATRIC: Mood and affect appropriate to situation with dementia, no behavioral issues  Patient Active Problem List   Diagnosis Date Noted  . Renal insufficiency 04/05/2016  . Sepsis ([place]) 12/17/2015  . Pulmonary nodule, left 12/17/2015  . UTI  (urinary tract infection) 12/17/2015  . Insomnia 12/17/2015  . Edema 05/02/2014  . HTN (hypertension) 05/02/2014  . AAA (abdominal aortic aneurysm) ([place]) 04/24/2014  . DDD (degenerative disc disease), lumbar 04/24/2014  . Encephalopathy, metabolic 99991111  . Hypertensive urgency 04/24/2014  . Dementia with behavioral disturbance   . Bladder cancer metastasized to pelvic region [facility])   . COPD (chronic obstructive pulmonary disease) ([place])   . Hypothyroid   . Hypertension   . Nicotine addiction 11/24/2013  . Lung nodule, multiple 10/01/2013    CMP     Component Value Date/Time   NA 142 06/15/2016   K 4.0 06/15/2016   BUN 38 (A) 06/15/2016   CREATININE 1.4 (A) 06/15/2016   AST 18 02/24/2016   ALT 11 02/24/2016   ALKPHOS 92 02/24/2016    Recent Labs  05/16/16 06/01/16 06/15/16  NA 141 140 142  K 4.5 4.6 4.0  BUN 36* 36* 38*  CREATININE 1.5* 1.2* 1.4*    Recent Labs  12/06/15 02/24/16  AST 35 18  ALT 22 11  ALKPHOS 91 92    Recent Labs  12/10/15 02/24/16 05/16/16  WBC 8.3 7.9 8.4  HGB 11.1* 10.9* 5.1*  HCT 34* 35* 13*  PLT  --  192 160    Recent Labs  02/24/16  CHOL 177  LDLCALC 91  TRIG 97   No results found for: [facility] Lab Results  Component Value Date   TSH 2.48 02/24/2016   Lab Results  Component Value Date   HGBA1C 5.5 02/24/2016   Lab Results  Component Value Date   CHOL 177 02/24/2016   HDL 67 02/24/2016   LDLCALC 91 02/24/2016   TRIG 97 02/24/2016    Significant Diagnostic Results in last 30 days:  No results found.  Assessment and Plan  HCAP - pt with rales, cough , desatting; ordered CBC, BMP, CXR PA and lat, O2 2L [place] to keep O2> 92%, alb nebs scheduled TID, mucinex 600 mg BID; Calc CrCL 21.9 with Cr 1.42; have written for augmentin 500 mg q 12 for 7 days   Late entry - CXR was read a normal - don't care, clinically with PNA  Time spent > 35 min;> 50% of time with patient was spent reviewing records, labs, tests and  studies, counseling and developing plan of care  Webb Silversmith D. [name], MD

## 2016-08-02 NOTE — Assessment & Plan Note (Signed)
Controlled; cont Norvasc 10 mg daily

## 2016-08-02 NOTE — Assessment & Plan Note (Signed)
Controlled; does not use prn nebs;plan to cont alb nebs prn

## 2016-08-07 ENCOUNTER — Non-Acute Institutional Stay (SKILLED_NURSING_FACILITY): Payer: Medicare Other | Admitting: Internal Medicine

## 2016-08-07 ENCOUNTER — Encounter: Payer: Self-pay | Admitting: Internal Medicine

## 2016-08-07 DIAGNOSIS — E034 Atrophy of thyroid (acquired): Secondary | ICD-10-CM

## 2016-08-07 DIAGNOSIS — G301 Alzheimer's disease with late onset: Secondary | ICD-10-CM | POA: Diagnosis not present

## 2016-08-07 DIAGNOSIS — J189 Pneumonia, unspecified organism: Secondary | ICD-10-CM | POA: Diagnosis not present

## 2016-08-07 DIAGNOSIS — F0281 Dementia in other diseases classified elsewhere with behavioral disturbance: Secondary | ICD-10-CM | POA: Diagnosis not present

## 2016-08-07 DIAGNOSIS — F02818 Alzheimer's disease with late onset: Secondary | ICD-10-CM

## 2016-08-07 NOTE — Assessment & Plan Note (Signed)
TSH 2. 48 on 50 mg synthroid; cont same and order new.TSH

## 2016-08-07 NOTE — Assessment & Plan Note (Signed)
Today is day 6 of Augmentin, nebs, mucinex; ;pt up out of bed, no O2, improved

## 2016-08-07 NOTE — Progress Notes (Signed)
Location:  [place] Room Number: [id number] Place of Service:  SNF (31)  Annette [name]. [name], MD    Extended Emergency Contact Information Primary Emergency Contact: [facility] Address: [address]          [place], [place] [address] Johnnette Litter of [place] Phone: [phone] Mobile Phone: [phone] Relation: Son Secondary Emergency Contact: Sharene Butters Address: [address]          [place], [place] [address] [place] Phone: [phone] Mobile Phone: [phone] Relation: Relative    Allergies: Contrast media [iodinated diagnostic agents]; Beta adrenergic blockers; Ciprofloxacin; Combigan [brimonidine tartrate-timolol]; Iodine; Moxifloxacin; Sulfa antibiotics; and Tetracyclines & related  Chief Complaint  Patient presents with  . Medical Management of Chronic Issues    Routine Visit    HPI: Patient is 82 y.o. female who is being seen for routine issues of hypothroidism, dementia and for f/u of her recent HCAP.  Past Medical History:  Diagnosis Date  . Bladder cancer metastasized to pelvic region [facility])   . COPD (chronic obstructive pulmonary disease) ([place])   . Dementia without behavioral disturbance   . Hypertension   . Hypothyroid     Past Surgical History:  Procedure Laterality Date  . EYE SURGERY     cataract  . URETERAL STENT PLACEMENT Right    2/2 bladder CA  . vaginal tumor     removed in remote past      Medication List       Accurate as of 08/07/16  1:07 PM. Always use your most recent med list.          acetaminophen 325 MG tablet Commonly known as:  TYLENOL Take 650 mg by mouth every 6 (six) hours as needed for mild pain, moderate pain or fever.   albuterol (2.5 MG/3ML) 0.083% nebulizer solution Commonly known as:  PROVENTIL Take 2.5 mg by nebulization every 4 (four) hours as needed for shortness of breath.   amLODipine 10 MG tablet Commonly known as:  NORVASC Take 10 mg by mouth  daily.   bimatoprost 0.01 % Soln Commonly known as:  LUMIGAN Place 1 drop into both eyes at bedtime.   docusate sodium 100 MG capsule Commonly known as:  COLACE Take 100 mg by mouth 2 (two) times daily.   donepezil 5 MG tablet Commonly known as:  ARICEPT Take 5 mg by mouth daily.   levothyroxine 50 MCG tablet Commonly known as:  SYNTHROID, LEVOTHROID Take 50 mcg by mouth daily before breakfast.   NUTRITIONAL SUPPLEMENT PO Offer Magic Cup to lunch/dinner tray       No orders of the defined types were placed in this encounter.   Immunization History  Administered Date(s) Administered  . PPD Test 12/29/2015, 01/09/2016    Social History  Substance Use Topics  . Smoking status: Former Smoker    Packs/day: 0.50    Years: 60.00    Types: Cigarettes  . Smokeless tobacco: Never Used  . Alcohol use No    Review of Systems  DATA OBTAINED: from patient, nurse, medical record, family member GENERAL:  no fevers, fatigue, appetite changes SKIN: No itching, rash HEENT: No complaint RESPIRATORY: + cough,  no wheezing,  noSOB CARDIAC: No chest pain, palpitations, lower extremity edema  GI: No abdominal pain, No N/V/D or constipation, No heartburn or reflux  GU: No dysuria, frequency or urgency, or incontinence  MUSCULOSKELETAL: No unrelieved bone/joint pain NEUROLOGIC: No headache, dizziness  PSYCHIATRIC: No overt  anxiety or sadness  Vitals:   08/07/16 0814  BP: 115/68  Pulse: (!) 56  Resp: 20  Temp: 98.1 F (36.7 C)   Body mass index is 19.64 kg/m. Physical Exam  GENERAL APPEARANCE: Alert, conversant, No acute distress  SKIN: No diaphoresis rash HEENT: Unremarkable RESPIRATORY: Breathing is even, unlabored. Lung sounds are slt rhonchi, no weezing  CARDIOVASCULAR: Heart RRR no murmurs, rubs or gallops. No peripheral edema  GASTROINTESTINAL: Abdomen is soft, non-tender, not distended w/ normal bowel sounds.  GENITOURINARY: Bladder non tender, not distended    MUSCULOSKELETAL: No abnormal joints or musculature NEUROLOGIC: Cranial nerves 2-12 grossly intact. Moves all extremities PSYCHIATRIC: Mood and affect appropriate to situation with dementia, no behavioral issues  Patient Active Problem List   Diagnosis Date Noted  . HCAP (healthcare-associated pneumonia) 08/07/2016  . Constipation 08/02/2016  . Renal insufficiency 04/05/2016  . Sepsis ([place]) 12/17/2015  . Pulmonary nodule, left 12/17/2015  . UTI (urinary tract infection) 12/17/2015  . Insomnia 12/17/2015  . Edema 05/02/2014  . HTN (hypertension) 05/02/2014  . AAA (abdominal aortic aneurysm) ([place]) 04/24/2014  . DDD (degenerative disc disease), lumbar 04/24/2014  . Encephalopathy, metabolic 99991111  . Hypertensive urgency 04/24/2014  . Dementia with behavioral disturbance   . Bladder cancer metastasized to pelvic region [facility])   . COPD (chronic obstructive pulmonary disease) ([place])   . Hypothyroid   . Hypertension   . Nicotine addiction 11/24/2013  . Lung nodule, multiple 10/01/2013    CMP     Component Value Date/Time   NA 142 06/15/2016   K 4.0 06/15/2016   BUN 38 (A) 06/15/2016   CREATININE 1.4 (A) 06/15/2016   AST 18 02/24/2016   ALT 11 02/24/2016   ALKPHOS 92 02/24/2016    Recent Labs  05/16/16 06/01/16 06/15/16  NA 141 140 142  K 4.5 4.6 4.0  BUN 36* 36* 38*  CREATININE 1.5* 1.2* 1.4*    Recent Labs  12/06/15 02/24/16  AST 35 18  ALT 22 11  ALKPHOS 91 92    Recent Labs  12/10/15 02/24/16 05/16/16  WBC 8.3 7.9 8.4  HGB 11.1* 10.9* 5.1*  HCT 34* 35* 13*  PLT  --  192 160    Recent Labs  02/24/16  CHOL 177  LDLCALC 91  TRIG 97   No results found for: [facility] Lab Results  Component Value Date   TSH 2.48 02/24/2016   Lab Results  Component Value Date   HGBA1C 5.5 02/24/2016   Lab Results  Component Value Date   CHOL 177 02/24/2016   HDL 67 02/24/2016   LDLCALC 91 02/24/2016   TRIG 97 02/24/2016    Significant Diagnostic Results  in last 30 days:  No results found.  Assessment and Plan  Hypothyroid TSH 2. 48 on 50 mg synthroid; cont same and order new.TSH  Dementia with behavioral disturbance Continues on aricept 5 mg qHS, stable;cont aricept  HCAP (healthcare-associated pneumonia) Today is day 6 of Augmentin, nebs, mucinex; ;pt up out of bed, no O2, improved     Noralyn Karim D. [name], MD

## 2016-08-07 NOTE — Assessment & Plan Note (Signed)
Continues on aricept 5 mg qHS, stable;cont aricept

## 2016-08-10 LAB — VITAMIN D 25 HYDROXY (VIT D DEFICIENCY, FRACTURES): VIT D 25 HYDROXY: 35

## 2016-08-16 LAB — BASIC METABOLIC PANEL
BUN: 30 mg/dL — AB (ref 4–21)
Creatinine: 1.3 mg/dL — AB (ref 0.5–1.1)
GLUCOSE: 85 mg/dL
Potassium: 4.1 mmol/L (ref 3.4–5.3)
SODIUM: 140 mmol/L (ref 137–147)

## 2016-09-03 ENCOUNTER — Non-Acute Institutional Stay (SKILLED_NURSING_FACILITY): Payer: Medicare Other | Admitting: Internal Medicine

## 2016-09-03 ENCOUNTER — Encounter: Payer: Self-pay | Admitting: Internal Medicine

## 2016-09-03 DIAGNOSIS — F0281 Dementia in other diseases classified elsewhere with behavioral disturbance: Secondary | ICD-10-CM

## 2016-09-03 DIAGNOSIS — I10 Essential (primary) hypertension: Secondary | ICD-10-CM | POA: Diagnosis not present

## 2016-09-03 DIAGNOSIS — G301 Alzheimer's disease with late onset: Secondary | ICD-10-CM | POA: Diagnosis not present

## 2016-09-03 DIAGNOSIS — H353290 Exudative age-related macular degeneration, unspecified eye, stage unspecified: Secondary | ICD-10-CM

## 2016-09-03 NOTE — Progress Notes (Signed)
Location:  [place] Room Number: [id number] Place of Service:  SNF (31)  [name], MD  No care team member to display  Extended Emergency Contact Information Primary Emergency Contact: [facility] Address: [address]          [place], [place] [address] Johnnette Litter of [place] Phone: [phone] Mobile Phone: [phone] Relation: Son Secondary Emergency Contact: Sharene Butters Address: [place] [address]          [place], [place] [address] [place] Phone: [phone] Mobile Phone: [phone] Relation: Relative    Allergies: Contrast media [iodinated diagnostic agents]; Beta adrenergic blockers; Ciprofloxacin; Combigan [brimonidine tartrate-timolol]; Iodine; Moxifloxacin; Sulfa antibiotics; and Tetracyclines & related  Chief Complaint  Patient presents with  . Medical Management of Chronic Issues    Routine Visit    HPI: Patient is 82 y.o. female who is being seen for routine issues of dementia, HTN and macular degeneration.  Past Medical History:  Diagnosis Date  . Bladder cancer metastasized to pelvic region [facility])   . COPD (chronic obstructive pulmonary disease) ([place])   . Dementia without behavioral disturbance   . Hypertension   . Hypothyroid     Past Surgical History:  Procedure Laterality Date  . EYE SURGERY     cataract  . URETERAL STENT PLACEMENT Right    2/2 bladder CA  . vaginal tumor     removed in remote past      Medication List       Accurate as of 09/03/16 11:59 PM. Always use your most recent med list.          acetaminophen 325 MG tablet Commonly known as:  TYLENOL Take 650 mg by mouth every 6 (six) hours as needed for mild pain, moderate pain or fever.   albuterol (2.5 MG/3ML) 0.083% nebulizer solution Commonly known as:  PROVENTIL Take 2.5 mg by nebulization every 4 (four) hours as needed for shortness of breath.   amLODipine 10 MG tablet Commonly known as:  NORVASC Take 10  mg by mouth daily.   bimatoprost 0.01 % Soln Commonly known as:  LUMIGAN Place 1 drop into both eyes at bedtime.   docusate sodium 100 MG capsule Commonly known as:  COLACE Take 100 mg by mouth 2 (two) times daily.   donepezil 5 MG tablet Commonly known as:  ARICEPT Take 5 mg by mouth daily.   levothyroxine 50 MCG tablet Commonly known as:  SYNTHROID, LEVOTHROID Take 50 mcg by mouth daily before breakfast.   NUTRITIONAL SUPPLEMENT PO Offer Magic Cup to lunch/dinner tray   OXYGEN Inhale 2 L into the lungs continuous. To keep sats above 92%       No orders of the defined types were placed in this encounter.   Immunization History  Administered Date(s) Administered  . PPD Test 12/29/2015, 01/09/2016    Social History  Substance Use Topics  . Smoking status: Former Smoker    Packs/day: 0.50    Years: 60.00    Types: Cigarettes  . Smokeless tobacco: Never Used  . Alcohol use No    Review of Systems  UTO 2/2 dementia  Vitals:   09/03/16 1147  BP: (!) 117/59  Pulse: (!) 53  Resp: 18  Temp: (!) 96.6 F (35.9 C)   Body mass index is 19.4 kg/m. Physical Exam  GENERAL APPEARANCE: Alert, mod, conversant, No acute distress  SKIN: No diaphoresis rash HEENT: Unremarkable RESPIRATORY: Breathing is even, unlabored. Lung sounds  are clear   CARDIOVASCULAR: Heart RRR no murmurs, rubs or gallops. No peripheral edema  GASTROINTESTINAL: Abdomen is soft, non-tender, not distended w/ normal bowel sounds.  GENITOURINARY: Bladder non tender, not distended  MUSCULOSKELETAL: No abnormal joints or musculature NEUROLOGIC: Cranial nerves 2-12 grossly intact. Moves all extremities PSYCHIATRIC: Mood and affect appropriate to situation with dementia, no behavioral issues  Patient Active Problem List   Diagnosis Date Noted  . Macular degeneration, wet ([place]) 09/15/2016  . HCAP (healthcare-associated pneumonia) 08/07/2016  . Constipation 08/02/2016  . Renal insufficiency  04/05/2016  . Sepsis ([place]) 12/17/2015  . Pulmonary nodule, left 12/17/2015  . UTI (urinary tract infection) 12/17/2015  . Insomnia 12/17/2015  . Edema 05/02/2014  . HTN (hypertension) 05/02/2014  . AAA (abdominal aortic aneurysm) ([place]) 04/24/2014  . DDD (degenerative disc disease), lumbar 04/24/2014  . Encephalopathy, metabolic 99991111  . Hypertensive urgency 04/24/2014  . Dementia with behavioral disturbance   . Bladder cancer metastasized to pelvic region [facility])   . COPD (chronic obstructive pulmonary disease) ([place])   . Hypothyroid   . Hypertension   . Nicotine addiction 11/24/2013  . Lung nodule, multiple 10/01/2013    CMP     Component Value Date/Time   NA 142 06/15/2016   K 4.0 06/15/2016   BUN 38 (A) 06/15/2016   CREATININE 1.4 (A) 06/15/2016   AST 18 02/24/2016   ALT 11 02/24/2016   ALKPHOS 92 02/24/2016    Recent Labs  05/16/16 06/01/16 06/15/16  NA 141 140 142  K 4.5 4.6 4.0  BUN 36* 36* 38*  CREATININE 1.5* 1.2* 1.4*    Recent Labs  12/06/15 02/24/16  AST 35 18  ALT 22 11  ALKPHOS 91 92    Recent Labs  12/10/15 02/24/16 05/16/16  WBC 8.3 7.9 8.4  HGB 11.1* 10.9* 5.1*  HCT 34* 35* 13*  PLT  --  192 160    Recent Labs  02/24/16  CHOL 177  LDLCALC 91  TRIG 97   No results found for: [facility] Lab Results  Component Value Date   TSH 2.48 02/24/2016   Lab Results  Component Value Date   HGBA1C 5.5 02/24/2016   Lab Results  Component Value Date   CHOL 177 02/24/2016   HDL 67 02/24/2016   LDLCALC 91 02/24/2016   TRIG 97 02/24/2016    Significant Diagnostic Results in last 30 days:  No results found.  Assessment and Plan  Dementia with behavioral disturbance Stable, no major declines;plan to cont aricept 5 mg qHS  HTN (hypertension) Controlled;cont norvasc 10 mg daily  Macular degeneration, wet ([place]) Pt recently has intravitreal injection in R eye; no treatment to L as is endstage    Webb Silversmith D. [name], MD

## 2016-09-15 ENCOUNTER — Encounter: Payer: Self-pay | Admitting: Internal Medicine

## 2016-09-15 DIAGNOSIS — H353290 Exudative age-related macular degeneration, unspecified eye, stage unspecified: Secondary | ICD-10-CM | POA: Insufficient documentation

## 2016-09-15 NOTE — Assessment & Plan Note (Signed)
Stable, no major declines;plan to cont aricept 5 mg qHS

## 2016-09-15 NOTE — Assessment & Plan Note (Signed)
Pt recently has intravitreal injection in R eye; no treatment to L as is endstage

## 2016-09-15 NOTE — Assessment & Plan Note (Signed)
Controlled;cont norvasc 10 mg daily

## 2016-09-18 LAB — BASIC METABOLIC PANEL
BUN: 37 mg/dL — AB (ref 4–21)
CREATININE: 1.7 mg/dL — AB (ref 0.5–1.1)
Glucose: 91 mg/dL
POTASSIUM: 4.5 mmol/L (ref 3.4–5.3)
Sodium: 142 mmol/L (ref 137–147)

## 2016-09-18 LAB — LIPID PANEL
Cholesterol: 157 mg/dL (ref 0–200)
HDL: 59 mg/dL (ref 35–70)
LDL Cholesterol: 87 mg/dL
TRIGLYCERIDES: 57 mg/dL (ref 40–160)

## 2016-09-18 LAB — HEPATIC FUNCTION PANEL
ALK PHOS: 77 U/L (ref 25–125)
ALT: 10 U/L (ref 7–35)
AST: 18 U/L (ref 13–35)
BILIRUBIN, TOTAL: 0.2 mg/dL

## 2016-09-18 LAB — TSH: TSH: 2.15 u[IU]/mL (ref 0.41–5.90)

## 2016-09-18 LAB — CBC AND DIFFERENTIAL
HCT: 36 % (ref 36–46)
Hemoglobin: 11.6 g/dL — AB (ref 12.0–16.0)
PLATELETS: 138 10*3/uL — AB (ref 150–399)
WBC: 7.2 10^3/mL

## 2016-09-18 LAB — HEMOGLOBIN A1C: HEMOGLOBIN A1C: 5.7

## 2016-10-04 ENCOUNTER — Encounter: Payer: Self-pay | Admitting: Internal Medicine

## 2016-10-04 ENCOUNTER — Non-Acute Institutional Stay (SKILLED_NURSING_FACILITY): Payer: Medicare Other | Admitting: Internal Medicine

## 2016-10-04 DIAGNOSIS — J42 Unspecified chronic bronchitis: Secondary | ICD-10-CM

## 2016-10-04 DIAGNOSIS — E034 Atrophy of thyroid (acquired): Secondary | ICD-10-CM

## 2016-10-04 DIAGNOSIS — K5904 Chronic idiopathic constipation: Secondary | ICD-10-CM

## 2016-10-04 NOTE — Progress Notes (Signed)
Location:  [facility] Room Number: [id number] Place of Service:  SNF (31)   [name], MD  No care team member to display  Extended Emergency Contact Information Primary Emergency Contact: [facility] Address: [address]          [place], [place] [address] Johnnette Litter of [place] Phone: [phone] Mobile Phone: [phone] Relation: Son Secondary Emergency Contact: Sharene Butters Address: [address]          [place], [place] [address] [place] of [place] Phone: [phone] Mobile Phone: [phone] Relation: Relative    Allergies: Contrast media [iodinated diagnostic agents]; Beta adrenergic blockers; Ciprofloxacin; Combigan [brimonidine tartrate-timolol]; Iodine; Moxifloxacin; Sulfa antibiotics; and Tetracyclines & related  Chief Complaint  Patient presents with  . Medical Management of Chronic Issues    Routine Visit    HPI: Patient is 82 y.o. female who is being seen for routine issues of constipation, COPD, hypothyroidism.   Past Medical History:  Diagnosis Date  . Bladder cancer metastasized to pelvic region [facility])   . COPD (chronic obstructive pulmonary disease) ([place])   . Dementia without behavioral disturbance   . Hypertension   . Hypothyroid     Past Surgical History:  Procedure Laterality Date  . EYE SURGERY     cataract  . URETERAL STENT PLACEMENT Right    2/2 bladder CA  . vaginal tumor     removed in remote past    Allergies as of 10/04/2016      Reactions   Contrast Media [iodinated Diagnostic Agents] Hives   Beta Adrenergic Blockers Other (See Comments)   Low heart rate    Ciprofloxacin    Combigan [brimonidine Tartrate-timolol]    Iodine    Moxifloxacin    Other reaction(s): Dizziness (intolerance)   Sulfa Antibiotics    Tetracyclines & Related       Medication List       Accurate as of 10/04/16 11:59 PM. Always use your most recent med list.          acetaminophen 325 MG  tablet Commonly known as:  TYLENOL Take 650 mg by mouth every 6 (six) hours as needed for mild pain, moderate pain or fever.   albuterol (2.5 MG/3ML) 0.083% nebulizer solution Commonly known as:  PROVENTIL Take 2.5 mg by nebulization every 4 (four) hours as needed for shortness of breath.   amLODipine 10 MG tablet Commonly known as:  NORVASC Take 10 mg by mouth daily.   bimatoprost 0.01 % Soln Commonly known as:  LUMIGAN Place 1 drop into both eyes at bedtime.   docusate sodium 100 MG capsule Commonly known as:  COLACE Take 100 mg by mouth 2 (two) times daily.   donepezil 5 MG tablet Commonly known as:  ARICEPT Take 5 mg by mouth daily.   levothyroxine 50 MCG tablet Commonly known as:  SYNTHROID, LEVOTHROID Take 50 mcg by mouth daily before breakfast.   NUTRITIONAL SUPPLEMENT PO Offer Magic Cup to lunch/dinner tray   OXYGEN Inhale 2 L into the lungs continuous. To keep sats above 92%   PRESERVISION AREDS 2 Caps Take 1 capsule by mouth 2 (two) times daily.       No orders of the defined types were placed in this encounter.   Immunization History  Administered Date(s) Administered  . Influenza-Unspecified 07/06/2015  . PPD Test 12/29/2015, 01/09/2016  . Pneumococcal-Unspecified 07/06/2015    Social History  Substance Use Topics  . Smoking status: Former Smoker  Packs/day: 0.50    Years: 60.00    Types: Cigarettes  . Smokeless tobacco: Never Used  . Alcohol use No    Review of Systems  DATA OBTAINED: from patient, nurse GENERAL:  no fevers, fatigue, appetite changes SKIN: No itching, rash HEENT: No complaint RESPIRATORY: No cough, wheezing, SOB CARDIAC: No chest pain, palpitations, lower extremity edema  GI: No abdominal pain, No N/V/D or constipation, No heartburn or reflux  GU: No dysuria, frequency or urgency, or incontinence  MUSCULOSKELETAL: No unrelieved bone/joint pain NEUROLOGIC: No headache, dizziness  PSYCHIATRIC: No overt anxiety or  sadness  Vitals:   10/04/16 1428  BP: 119/63  Pulse: 78  Resp: (!) 22  Temp: 97.6 F (36.4 C)   Body mass index is 18.9 kg/m. Physical Exam  GENERAL APPEARANCE: Alert, conversant, No acute distress  SKIN: No diaphoresis rash HEENT: Unremarkable RESPIRATORY: Breathing is even, unlabored. Lung sounds are clear   CARDIOVASCULAR: Heart RRR no murmurs, rubs or gallops. No peripheral edema  GASTROINTESTINAL: Abdomen is soft, non-tender, not distended w/ normal bowel sounds.  GENITOURINARY: Bladder non tender, not distended  MUSCULOSKELETAL: No abnormal joints or musculature NEUROLOGIC: Cranial nerves 2-12 grossly intact. Moves all extremities PSYCHIATRIC: Mood and affect appropriate to situation with dementia, no behavioral issues  Patient Active Problem List   Diagnosis Date Noted  . Macular degeneration, wet ([place]) 09/15/2016  . HCAP (healthcare-associated pneumonia) 08/07/2016  . Constipation 08/02/2016  . Renal insufficiency 04/05/2016  . Sepsis ([place]) 12/17/2015  . Pulmonary nodule, left 12/17/2015  . UTI (urinary tract infection) 12/17/2015  . Insomnia 12/17/2015  . Edema 05/02/2014  . HTN (hypertension) 05/02/2014  . AAA (abdominal aortic aneurysm) ([place]) 04/24/2014  . DDD (degenerative disc disease), lumbar 04/24/2014  . Encephalopathy, metabolic 99991111  . Hypertensive urgency 04/24/2014  . Dementia with behavioral disturbance   . Bladder cancer metastasized to pelvic region [facility])   . COPD (chronic obstructive pulmonary disease) ([place])   . Hypothyroid   . Hypertension   . Nicotine addiction 11/24/2013  . Lung nodule, multiple 10/01/2013    CMP     Component Value Date/Time   NA 142 09/18/2016   K 4.5 09/18/2016   BUN 37 (A) 09/18/2016   CREATININE 1.7 (A) 09/18/2016   AST 18 09/18/2016   ALT 10 09/18/2016   ALKPHOS 77 09/18/2016    Recent Labs  06/01/16 06/15/16 09/18/16  NA 140 142 142  K 4.6 4.0 4.5  BUN 36* 38* 37*  CREATININE 1.2* 1.4* 1.7*     Recent Labs  12/06/15 02/24/16 09/18/16  AST 35 18 18  ALT 22 11 10   ALKPHOS 91 92 77    Recent Labs  02/24/16 05/16/16 09/18/16  WBC 7.9 8.4 7.2  HGB 10.9* 5.1* 11.6*  HCT 35* 13* 36  PLT 192 160 138*    Recent Labs  02/24/16 09/18/16  CHOL 177 157  LDLCALC 91 87  TRIG 97 57   No results found for: [facility] Lab Results  Component Value Date   TSH 2.15 09/18/2016   Lab Results  Component Value Date   HGBA1C 5.7 09/18/2016   Lab Results  Component Value Date   CHOL 157 09/18/2016   HDL 59 09/18/2016   LDLCALC 87 09/18/2016   TRIG 57 09/18/2016    Significant Diagnostic Results in last 30 days:  No results found.  Assessment and Plan  Constipation Controlled ; cont colace 100 mg BID  COPD (chronic obstructive pulmonary disease) Controlled, no recent exacerbations;plan cont  prn alb nebs ( which pt never uses) and chronic O2  Hypothyroid TSH 2.15;plan to cont synthroid 50 mcg daily    Jowel Waltner D. [name], MD

## 2016-10-20 IMAGING — CT CT CHEST W/O CM
2 of 4 series · 15 of 36 positions shown, 18 images · non-contrast
Comparison: Chest CT dated 12/09/2015. Chest x-ray dated
12/06/2015.

CLINICAL DATA: Abnormal chest x-ray.  Evaluate lung nodules.

History of dimension bladder cancer.
EXAM:
CT CHEST WITHOUT CONTRAST
TECHNIQUE: Multidetector CT imaging of the chest was performed following the
standard protocol without IV contrast.

[Series 5: lung windows · axial · 0.70mm/px · z∈[+1004,+1280]mm · 12 of 67 slices shown, 15 images]
[im 6/67  mediastinal]
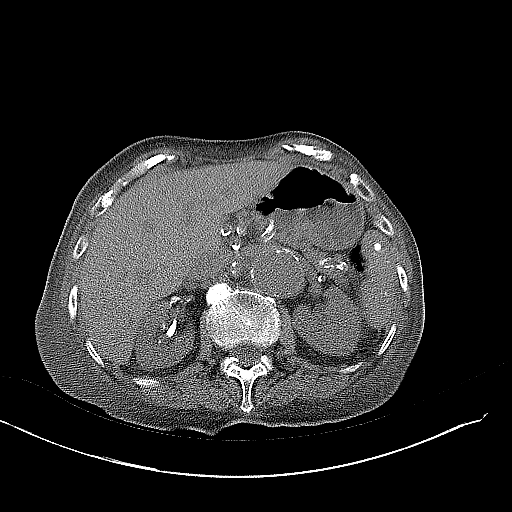
[im 6/67  lung]
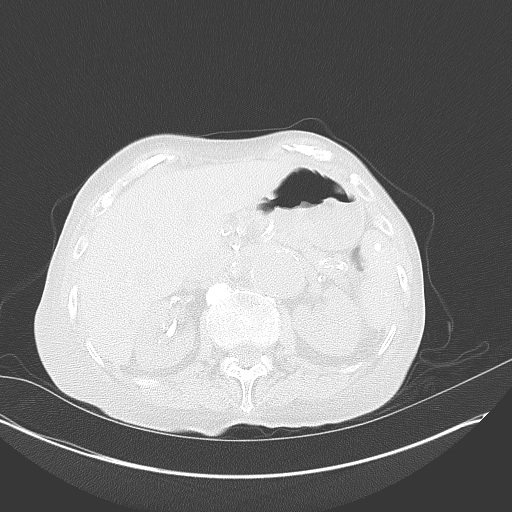
[im 11/67  lung]
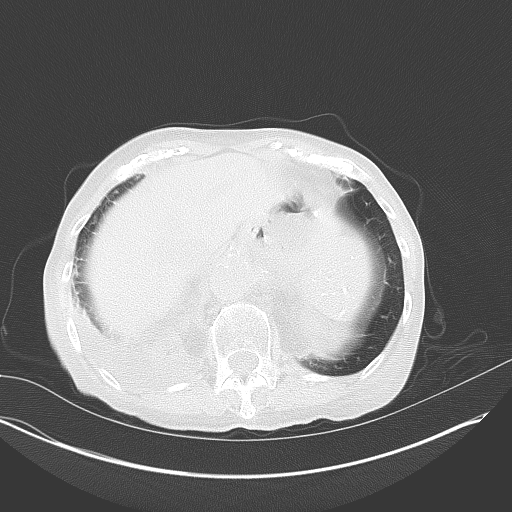
[im 16/67  lung]
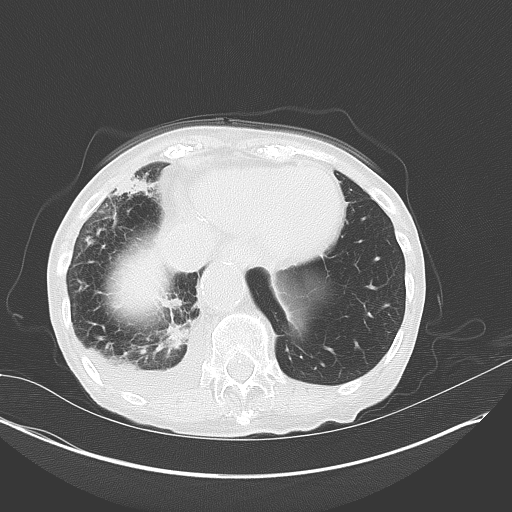
[im 21/67  lung]
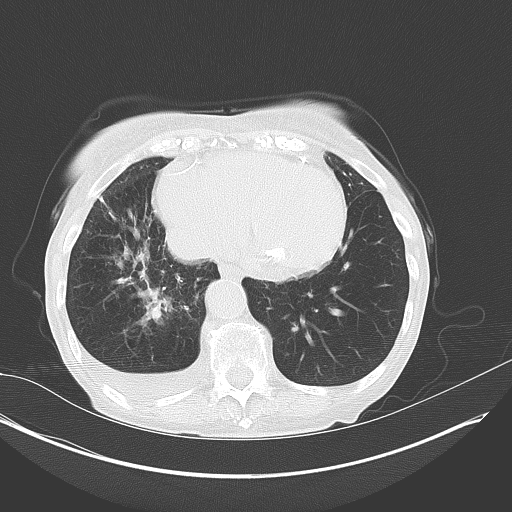
[im 26/67  mediastinal]
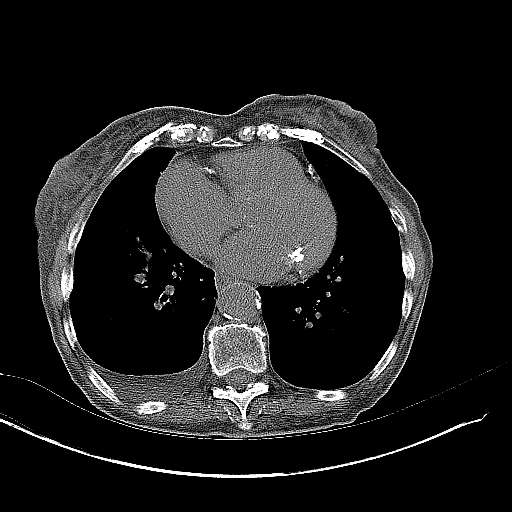
[im 26/67  lung]
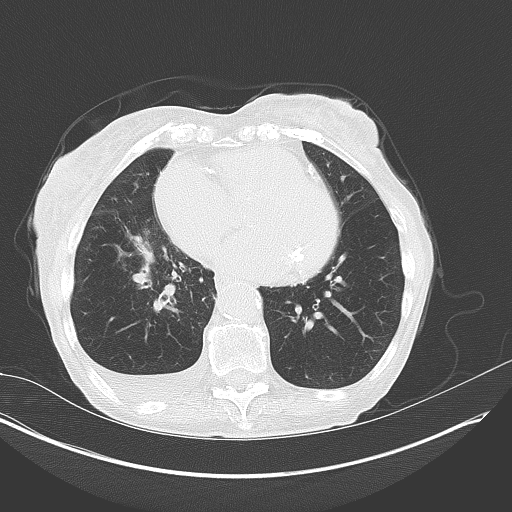
[im 31/67  lung]
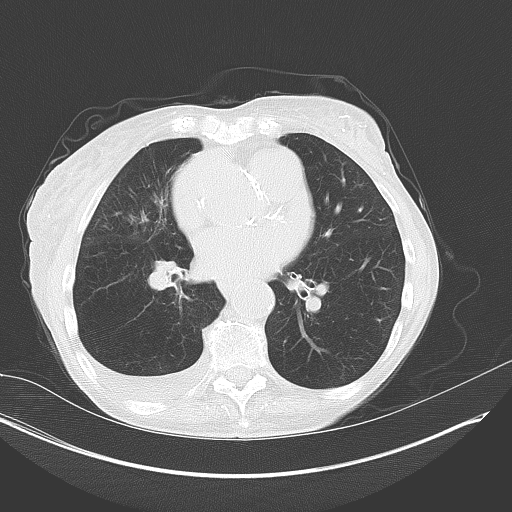
[im 36/67  lung]
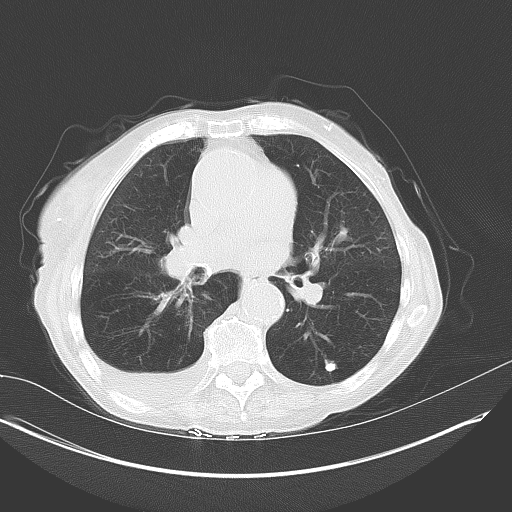
[im 41/67  lung]
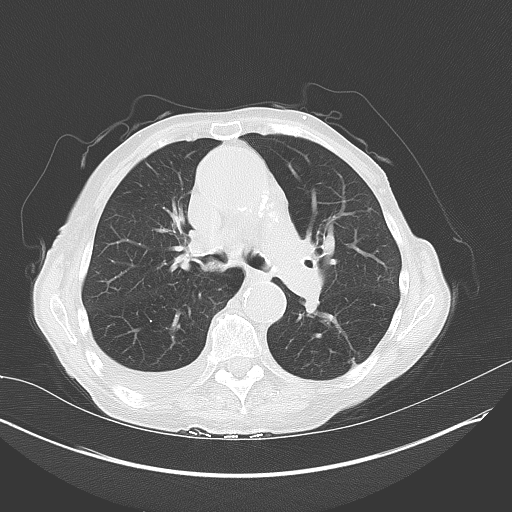
[im 46/67  mediastinal]
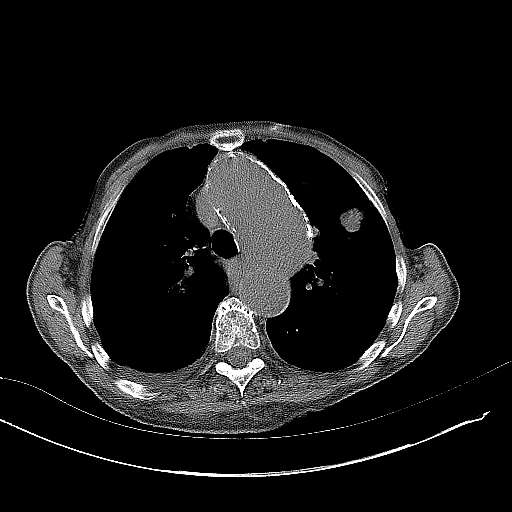
[im 46/67  lung]
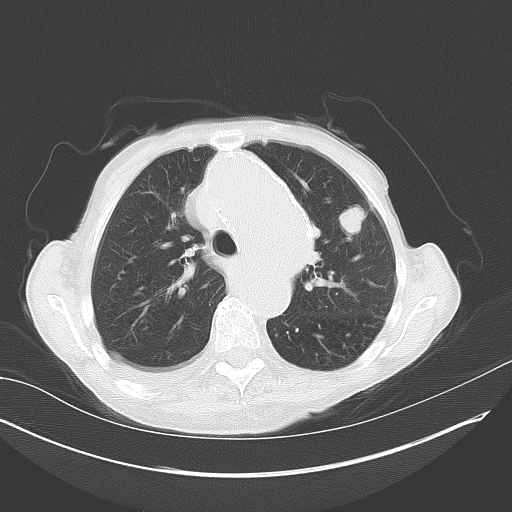
[im 51/67  lung]
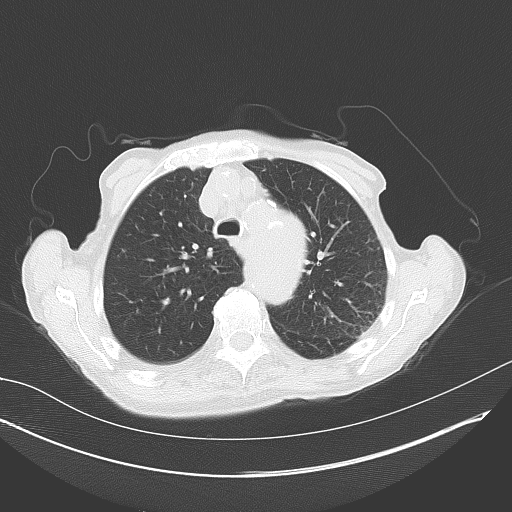
[im 56/67  lung]
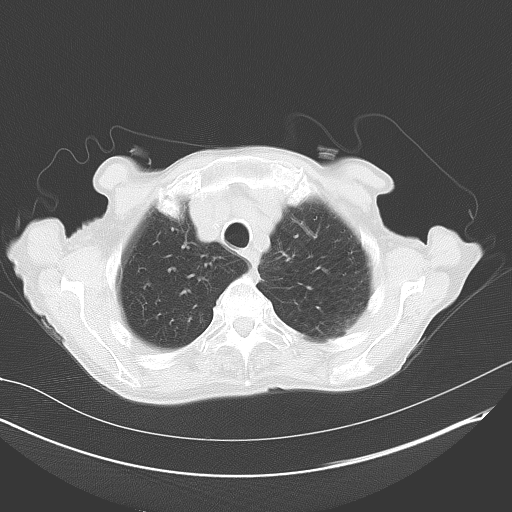
[im 61/67  lung]
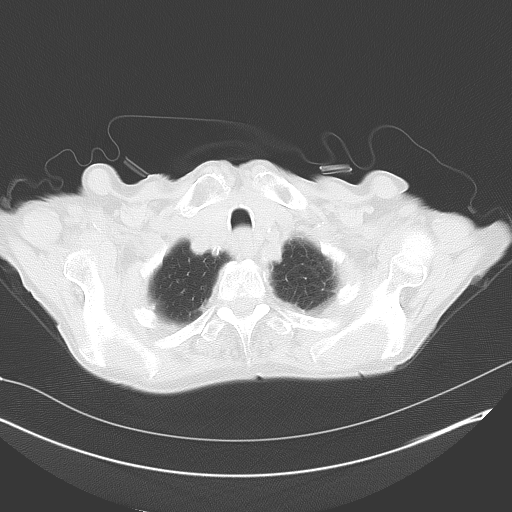

[Series 602: <mpr thick range> · coronal · 0.70mm/px · 3 of 85 slices shown]
[im 17/85  lung]
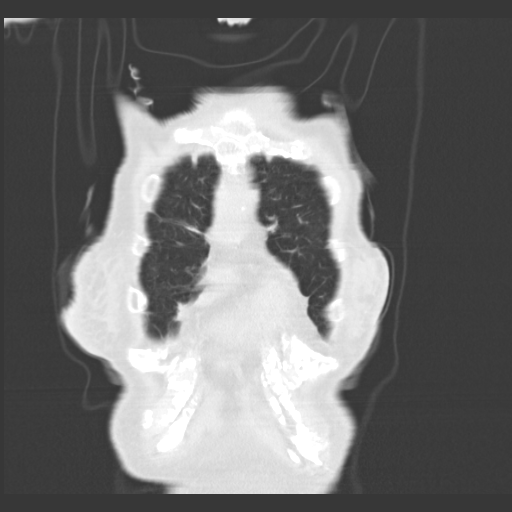
[im 34/85  lung]
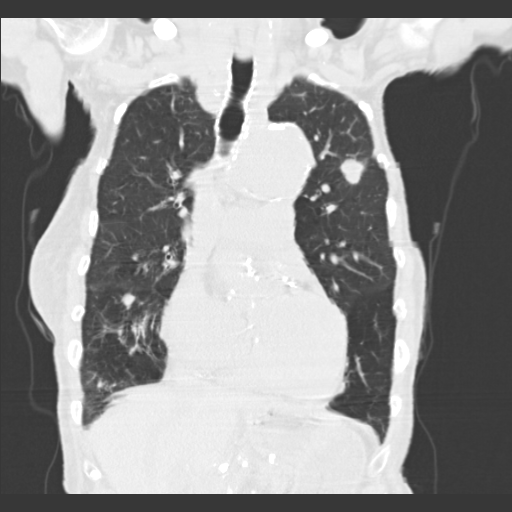
[im 51/85  lung]
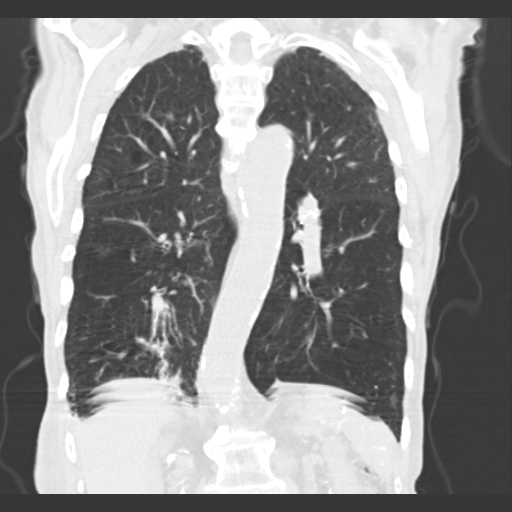

[15 of 36 positions shown; findings below may reference images not displayed]

FINDINGS: Mediastinum/Lymph Nodes: There is stable aneurysmal dilatation of
the thoracic aortic arch measuring 5.2 cm at its greatest point.
Stable milder dilatation of the ascending thoracic aorta.
Atherosclerotic changes again seen along the entire course of the
thoracic aorta.

Heart size is upper normal, stable. No pericardial effusion. Diffuse
coronary artery calcifications again noted. Scattered small lymph
nodes are seen within the mediastinum. No mass or enlarged lymph
nodes within the mediastinum or perihilar regions.

Lungs/Pleura: Left upper lobe pulmonary nodule is slightly increased
in size, measuring 1.8 x 1.7 x 1.7 cm (craniocaudal by transverse by
AP dimensions respectively), previously 1.6 x 1.5 x 1.7 cm in these
dimensions.

Stable calcified granuloma within the left lower lobe. 9 mm sub
solid nodule at the right lung base, located within the anterior
aspects of the right lower lobe (series 5, image 40), appears
stable. Patchy consolidations are now appreciated at the right lung
base, atelectasis versus early pneumonia. The right pleural
effusion, small to moderate in size, is stable.

Upper abdomen: No acute findings. Left renal cyst, incompletely
imaged. Benign cyst within the left hepatic lobe, also incompletely
imaged, initially seen on abdomen MRI of 2399.

Musculoskeletal: Degenerative changes of the thoracolumbar spine. No
acute or suspicious osseous lesion.
IMPRESSION: 1. Slight enlargement of the lobulated left upper lobe nodule, now
measuring 1.8 x 1.7 x 1.7 cm. This remains highly suspicious for
malignancy.
2. Stable sub solid nodule within the right lower lobe, measuring 9
mm greatest dimension. This is also suspicious for neoplastic
nodule.
3. Stable right pleural effusion, small to moderate in size.
4. New patchy consolidations at the right lung base. These could
represent atelectasis, pneumonia or aspiration. Additional
neoplastic process is less likely.
5. Stable aneurysm of the thoracic aortic arch.

## 2016-10-21 ENCOUNTER — Encounter: Payer: Self-pay | Admitting: Internal Medicine

## 2016-10-21 NOTE — Assessment & Plan Note (Signed)
Controlled ; cont colace 100 mg BID

## 2016-10-21 NOTE — Assessment & Plan Note (Addendum)
Controlled, no recent exacerbations;plan cont prn alb nebs ( which pt never uses) and chronic O2

## 2016-10-21 NOTE — Assessment & Plan Note (Signed)
TSH 2.15;plan to cont synthroid 50 mcg daily

## 2016-11-06 ENCOUNTER — Non-Acute Institutional Stay (SKILLED_NURSING_FACILITY): Payer: Medicare Other | Admitting: Internal Medicine

## 2016-11-06 ENCOUNTER — Encounter: Payer: Self-pay | Admitting: Internal Medicine

## 2016-11-06 DIAGNOSIS — G301 Alzheimer's disease with late onset: Secondary | ICD-10-CM | POA: Diagnosis not present

## 2016-11-06 DIAGNOSIS — I10 Essential (primary) hypertension: Secondary | ICD-10-CM | POA: Diagnosis not present

## 2016-11-06 DIAGNOSIS — F0281 Dementia in other diseases classified elsewhere with behavioral disturbance: Secondary | ICD-10-CM | POA: Diagnosis not present

## 2016-11-06 DIAGNOSIS — H353290 Exudative age-related macular degeneration, unspecified eye, stage unspecified: Secondary | ICD-10-CM | POA: Diagnosis not present

## 2016-11-06 DIAGNOSIS — F02818 Dementia in other diseases classified elsewhere, unspecified severity, with other behavioral disturbance: Secondary | ICD-10-CM

## 2016-11-06 NOTE — Progress Notes (Signed)
Location:  [place] Room Number: [id number] Place of Service:  SNF (31)  [name], MD  No care team member to display  Extended Emergency Contact Information Primary Emergency Contact: [facility] Address: [address]          [place], [place] [address] Johnnette Litter of [place] Phone: [phone] Mobile Phone: [phone] Relation: Son Secondary Emergency Contact: Sharene Butters Address: [address]          [place], [place] [address] [place] of [place] Phone: [phone] Mobile Phone: [phone] Relation: Relative    Allergies: Contrast media [iodinated diagnostic agents]; Beta adrenergic blockers; Ciprofloxacin; Combigan [brimonidine tartrate-timolol]; Iodine; Moxifloxacin; Sulfa antibiotics; and Tetracyclines & related  Chief Complaint  Patient presents with  . Medical Management of Chronic Issues    Routine Visit    HPI: Patient is 83 y.o. female who is being seen for routine issues of dementia, HTN and macular degeneration.  Past Medical History:  Diagnosis Date  . Bladder cancer metastasized to pelvic region [facility])   . COPD (chronic obstructive pulmonary disease) ([place])   . Dementia without behavioral disturbance   . Hypertension   . Hypothyroid     Past Surgical History:  Procedure Laterality Date  . EYE SURGERY     cataract  . URETERAL STENT PLACEMENT Right    2/2 bladder CA  . vaginal tumor     removed in remote past    Allergies as of 11/06/2016      Reactions   Contrast Media [iodinated Diagnostic Agents] Hives   Beta Adrenergic Blockers Other (See Comments)   Low heart rate    Ciprofloxacin    Combigan [brimonidine Tartrate-timolol]    Iodine    Moxifloxacin    Other reaction(s): Dizziness (intolerance)   Sulfa Antibiotics    Tetracyclines & Related       Medication List       Accurate as of 11/06/16 11:59 PM. Always use your most recent med list.          acetaminophen 325 MG  tablet Commonly known as:  TYLENOL Take 650 mg by mouth every 6 (six) hours as needed for mild pain, moderate pain or fever.   albuterol (2.5 MG/3ML) 0.083% nebulizer solution Commonly known as:  PROVENTIL Take 2.5 mg by nebulization every 4 (four) hours as needed for shortness of breath.   amLODipine 10 MG tablet Commonly known as:  NORVASC Take 10 mg by mouth daily.   bimatoprost 0.01 % Soln Commonly known as:  LUMIGAN Place 1 drop into both eyes at bedtime.   docusate sodium 100 MG capsule Commonly known as:  COLACE Take 100 mg by mouth 2 (two) times daily.   donepezil 5 MG tablet Commonly known as:  ARICEPT Take 5 mg by mouth daily.   levothyroxine 50 MCG tablet Commonly known as:  SYNTHROID, LEVOTHROID Take 50 mcg by mouth daily before breakfast.   NUTRITIONAL SUPPLEMENT PO Offer Magic Cup to lunch/dinner tray   OXYGEN Inhale 2 L into the lungs continuous. To keep sats above 92%   PRESERVISION AREDS 2 Caps Take 1 capsule by mouth 2 (two) times daily.       No orders of the defined types were placed in this encounter.   Immunization History  Administered Date(s) Administered  . Influenza-Unspecified 07/06/2015  . PPD Test 12/29/2015, 01/09/2016  . Pneumococcal-Unspecified 07/06/2015    Social History  Substance Use Topics  . Smoking status: Former Smoker  Packs/day: 0.50    Years: 60.00    Types: Cigarettes  . Smokeless tobacco: Never Used  . Alcohol use No    Review of Systems  DATA OBTAINED: from patient, nurse GENERAL:  no fevers, fatigue, appetite changes SKIN: No itching, rash HEENT: No complaint RESPIRATORY: No cough, wheezing, SOB CARDIAC: No chest pain, palpitations, lower extremity edema  GI: No abdominal pain, No N/V/D or constipation, No heartburn or reflux  GU: No dysuria, frequency or urgency, or incontinence  MUSCULOSKELETAL: No unrelieved bone/joint pain NEUROLOGIC: No headache, dizziness  PSYCHIATRIC: No overt anxiety or  sadness  Vitals:   11/06/16 0954  BP: 101/70  Pulse: (!) 56  Resp: 18  Temp: (!) 96.4 F (35.8 C)   Body mass index is 19.37 kg/m. Physical Exam  GENERAL APPEARANCE: Alert, conversant, No acute distress  SKIN: No diaphoresis rash HEENT: Unremarkable RESPIRATORY: Breathing is even, unlabored. Lung sounds are clear   CARDIOVASCULAR: Heart RRR no murmurs, rubs or gallops. No peripheral edema  GASTROINTESTINAL: Abdomen is soft, non-tender, not distended w/ normal bowel sounds.  GENITOURINARY: Bladder non tender, not distended  MUSCULOSKELETAL: No abnormal joints or musculature NEUROLOGIC: Cranial nerves 2-12 grossly intact. Moves all extremities PSYCHIATRIC: Mood and affect appropriate to situation with dementia, no behavioral issues  Patient Active Problem List   Diagnosis Date Noted  . Chronic respiratory failure with hypoxia ([place]) 11/18/2016  . Macular degeneration, wet ([place]) 09/15/2016  . HCAP (healthcare-associated pneumonia) 08/07/2016  . Constipation 08/02/2016  . Renal insufficiency 04/05/2016  . Sepsis ([place]) 12/17/2015  . Pulmonary nodule, left 12/17/2015  . UTI (urinary tract infection) 12/17/2015  . Insomnia 12/17/2015  . Edema 05/02/2014  . HTN (hypertension) 05/02/2014  . AAA (abdominal aortic aneurysm) ([place]) 04/24/2014  . DDD (degenerative disc disease), lumbar 04/24/2014  . Encephalopathy, metabolic 99991111  . Hypertensive urgency 04/24/2014  . Dementia with behavioral disturbance   . Bladder cancer metastasized to pelvic region [facility])   . COPD (chronic obstructive pulmonary disease) ([place])   . Hypothyroid   . Hypertension   . Nicotine addiction 11/24/2013  . Lung nodule, multiple 10/01/2013    CMP     Component Value Date/Time   NA 142 09/18/2016   K 4.5 09/18/2016   BUN 37 (A) 09/18/2016   CREATININE 1.7 (A) 09/18/2016   AST 18 09/18/2016   ALT 10 09/18/2016   ALKPHOS 77 09/18/2016    Recent Labs  06/01/16 06/15/16 09/18/16  NA 140 142  142  K 4.6 4.0 4.5  BUN 36* 38* 37*  CREATININE 1.2* 1.4* 1.7*    Recent Labs  12/06/15 02/24/16 09/18/16  AST 35 18 18  ALT 22 11 10   ALKPHOS 91 92 77    Recent Labs  02/24/16 05/16/16 09/18/16  WBC 7.9 8.4 7.2  HGB 10.9* 5.1* 11.6*  HCT 35* 13* 36  PLT 192 160 138*    Recent Labs  02/24/16 09/18/16  CHOL 177 157  LDLCALC 91 87  TRIG 97 57   No results found for: [facility] Lab Results  Component Value Date   TSH 2.15 09/18/2016   Lab Results  Component Value Date   HGBA1C 5.7 09/18/2016   Lab Results  Component Value Date   CHOL 157 09/18/2016   HDL 59 09/18/2016   LDLCALC 87 09/18/2016   TRIG 57 09/18/2016    Significant Diagnostic Results in last 30 days:  No results found.  Assessment and Plan  Dementia with behavioral disturbance CHRONIC, STABLE; cont aricept 5  mg q HS  HTN (hypertension) Stable and controlled;cont norvasc 10 mg daily  Macular degeneration, wet ([facility]) L is endstage, R still receiving treatment; plan to cont preservision areds 2 BID    [name],  MD

## 2016-11-12 ENCOUNTER — Non-Acute Institutional Stay (SKILLED_NURSING_FACILITY): Payer: Medicare Other | Admitting: Internal Medicine

## 2016-11-12 ENCOUNTER — Encounter: Payer: Self-pay | Admitting: Internal Medicine

## 2016-11-12 DIAGNOSIS — J9611 Chronic respiratory failure with hypoxia: Secondary | ICD-10-CM

## 2016-11-12 DIAGNOSIS — R059 Cough, unspecified: Secondary | ICD-10-CM

## 2016-11-12 DIAGNOSIS — R05 Cough: Secondary | ICD-10-CM | POA: Diagnosis not present

## 2016-11-12 NOTE — Progress Notes (Signed)
Location:  [place] Room Number: [id number] Place of Service:  SNF (31)  [name], MD  No care team member to display  Extended Emergency Contact Information Primary Emergency Contact: [facility] Address: [address]          [place], [place] [address] Johnnette Litter of [place] Phone: [phone] Mobile Phone: [phone] Relation: Son Secondary Emergency Contact: Sharene Butters Address: [address]          [place], [place] [address] [place] Phone: [phone] Mobile Phone: [phone] Relation: Relative    Allergies: Contrast media [iodinated diagnostic agents]; Beta adrenergic blockers; Ciprofloxacin; Combigan [brimonidine tartrate-timolol]; Iodine; Moxifloxacin; Sulfa antibiotics; and Tetracyclines & related  Chief Complaint  Patient presents with  . Acute Visit    Acute    HPI: Patient is 83 y.o. female who nursing asked me to see because she has had a cough for several days. No fever, no other symptoms. Pt is on chronic O2, but she often does not wear it. Nothing makes cough better or worse. No nasal d/c.  Past Medical History:  Diagnosis Date  . Bladder cancer metastasized to pelvic region [facility])   . COPD (chronic obstructive pulmonary disease) ([place])   . Dementia without behavioral disturbance   . Hypertension   . Hypothyroid     Past Surgical History:  Procedure Laterality Date  . EYE SURGERY     cataract  . URETERAL STENT PLACEMENT Right    2/2 bladder CA  . vaginal tumor     removed in remote past    Allergies as of 11/12/2016      Reactions   Contrast Media [iodinated Diagnostic Agents] Hives   Beta Adrenergic Blockers Other (See Comments)   Low heart rate    Ciprofloxacin    Combigan [brimonidine Tartrate-timolol]    Iodine    Moxifloxacin    Other reaction(s): Dizziness (intolerance)   Sulfa Antibiotics    Tetracyclines & Related       Medication List       Accurate as of  11/12/16  4:20 PM. Always use your most recent med list.          acetaminophen 325 MG tablet Commonly known as:  TYLENOL Take 650 mg by mouth every 6 (six) hours as needed for mild pain, moderate pain or fever.   albuterol (2.5 MG/3ML) 0.083% nebulizer solution Commonly known as:  PROVENTIL Take 2.5 mg by nebulization every 4 (four) hours as needed for shortness of breath.   amLODipine 10 MG tablet Commonly known as:  NORVASC Take 10 mg by mouth daily.   bimatoprost 0.01 % Soln Commonly known as:  LUMIGAN Place 1 drop into both eyes at bedtime.   docusate sodium 100 MG capsule Commonly known as:  COLACE Take 100 mg by mouth 2 (two) times daily.   donepezil 5 MG tablet Commonly known as:  ARICEPT Take 5 mg by mouth daily.   levothyroxine 50 MCG tablet Commonly known as:  SYNTHROID, LEVOTHROID Take 50 mcg by mouth daily before breakfast.   NUTRITIONAL SUPPLEMENT PO Offer Magic Cup to lunch/dinner tray   OXYGEN Inhale 2 L into the lungs continuous. To keep sats above 92%   PRESERVISION AREDS 2 Caps Take 1 capsule by mouth 2 (two) times daily.       No orders of the defined types were placed in this encounter.   Immunization History  Administered Date(s) Administered  . Influenza-Unspecified 07/06/2015  .  PPD Test 12/29/2015, 01/09/2016  . Pneumococcal-Unspecified 07/06/2015    Social History  Substance Use Topics  . Smoking status: Former Smoker    Packs/day: 0.50    Years: 60.00    Types: Cigarettes  . Smokeless tobacco: Never Used  . Alcohol use No    Review of Systems  DATA OBTAINED: from patient- can participate minimally GENERAL:  no fevers, fatigue, appetite changes SKIN: No itching, rash HEENT: No complaint RESPIRATORY: + cough, no wheezing,no  SOB CARDIAC: No chest pain, palpitations, lower extremity edema  GI: No abdominal pain, No N/V/D or constipation, No heartburn or reflux  GU: No dysuria, frequency or urgency, or incontinence    MUSCULOSKELETAL: No unrelieved bone/joint pain NEUROLOGIC: No headache, dizziness  PSYCHIATRIC: No overt anxiety or sadness  Vitals:   11/12/16 1618  BP: 101/70  Pulse: (!) 53  Resp: 16  Temp: (!) 96.4 F (35.8 C)   Body mass index is 19.24 kg/m. Physical Exam  GENERAL APPEARANCE: Alert, min conversant, No acute distress  SKIN: No diaphoresis rash HEENT: Unremarkable RESPIRATORY: Breathing is even, unlabored. Lung sounds are diffusely decreased, no rales, no wheezes; O2 sat RA 82%  CARDIOVASCULAR: Heart RRR no murmurs, rubs or gallops. No peripheral edema  GASTROINTESTINAL: Abdomen is soft, non-tender, not distended w/ normal bowel sounds.  GENITOURINARY: Bladder non tender, not distended  MUSCULOSKELETAL: No abnormal joints or musculature NEUROLOGIC: Cranial nerves 2-12 grossly intact. Moves all extremities PSYCHIATRIC: dementia, no behavioral issues  Patient Active Problem List   Diagnosis Date Noted  . Macular degeneration, wet ([place]) 09/15/2016  . HCAP (healthcare-associated pneumonia) 08/07/2016  . Constipation 08/02/2016  . Renal insufficiency 04/05/2016  . Sepsis ([place]) 12/17/2015  . Pulmonary nodule, left 12/17/2015  . UTI (urinary tract infection) 12/17/2015  . Insomnia 12/17/2015  . Edema 05/02/2014  . HTN (hypertension) 05/02/2014  . AAA (abdominal aortic aneurysm) ([place]) 04/24/2014  . DDD (degenerative disc disease), lumbar 04/24/2014  . Encephalopathy, metabolic 99991111  . Hypertensive urgency 04/24/2014  . Dementia with behavioral disturbance   . Bladder cancer metastasized to pelvic region [facility])   . COPD (chronic obstructive pulmonary disease) ([place])   . Hypothyroid   . Hypertension   . Nicotine addiction 11/24/2013  . Lung nodule, multiple 10/01/2013    CMP     Component Value Date/Time   NA 142 09/18/2016   K 4.5 09/18/2016   BUN 37 (A) 09/18/2016   CREATININE 1.7 (A) 09/18/2016   AST 18 09/18/2016   ALT 10 09/18/2016   ALKPHOS 77  09/18/2016    Recent Labs  06/01/16 06/15/16 09/18/16  NA 140 142 142  K 4.6 4.0 4.5  BUN 36* 38* 37*  CREATININE 1.2* 1.4* 1.7*    Recent Labs  12/06/15 02/24/16 09/18/16  AST 35 18 18  ALT 22 11 10   ALKPHOS 91 92 77    Recent Labs  02/24/16 05/16/16 09/18/16  WBC 7.9 8.4 7.2  HGB 10.9* 5.1* 11.6*  HCT 35* 13* 36  PLT 192 160 138*    Recent Labs  02/24/16 09/18/16  CHOL 177 157  LDLCALC 91 87  TRIG 97 57   No results found for: [facility] Lab Results  Component Value Date   TSH 2.15 09/18/2016   Lab Results  Component Value Date   HGBA1C 5.7 09/18/2016   Lab Results  Component Value Date   CHOL 157 09/18/2016   HDL 59 09/18/2016   LDLCALC 87 09/18/2016   TRIG 57 09/18/2016    Significant  Diagnostic Results in last 30 days:  No results found.  Assessment and Plan  COUGH/ CHRONIC RESP FAILURE - HAVE ORDERED cxr, cbc, bmp AND INFLUENZA;ENCOURAGE o2 USE; WILL MONITOR     Aubrynn Katona D. [name], MD

## 2016-11-13 LAB — BASIC METABOLIC PANEL
BUN: 49 mg/dL — AB (ref 4–21)
Creatinine: 1.4 mg/dL — AB (ref 0.5–1.1)
GLUCOSE: 98 mg/dL
POTASSIUM: 4.3 mmol/L (ref 3.4–5.3)
Sodium: 141 mmol/L (ref 137–147)

## 2016-11-13 LAB — CBC AND DIFFERENTIAL
HEMATOCRIT: 39 % (ref 36–46)
HEMOGLOBIN: 12.5 g/dL (ref 12.0–16.0)
Platelets: 154 10*3/uL (ref 150–399)
WBC: 6.4 10*3/mL

## 2016-11-14 ENCOUNTER — Non-Acute Institutional Stay (SKILLED_NURSING_FACILITY): Payer: Medicare Other | Admitting: Internal Medicine

## 2016-11-14 DIAGNOSIS — Z20828 Contact with and (suspected) exposure to other viral communicable diseases: Secondary | ICD-10-CM

## 2016-11-18 ENCOUNTER — Encounter: Payer: Self-pay | Admitting: Internal Medicine

## 2016-11-18 DIAGNOSIS — J9611 Chronic respiratory failure with hypoxia: Secondary | ICD-10-CM | POA: Insufficient documentation

## 2016-11-18 NOTE — Assessment & Plan Note (Signed)
Stable and controlled;cont norvasc 10 mg daily

## 2016-11-18 NOTE — Assessment & Plan Note (Signed)
L is endstage, R still receiving treatment; plan to cont preservision areds 2 BID

## 2016-11-18 NOTE — Assessment & Plan Note (Signed)
CHRONIC, STABLE; cont aricept 5 mg q HS

## 2016-12-04 ENCOUNTER — Non-Acute Institutional Stay (SKILLED_NURSING_FACILITY): Payer: Medicare Other | Admitting: Internal Medicine

## 2016-12-04 ENCOUNTER — Encounter: Payer: Self-pay | Admitting: Internal Medicine

## 2016-12-04 DIAGNOSIS — G301 Alzheimer's disease with late onset: Secondary | ICD-10-CM

## 2016-12-04 DIAGNOSIS — I10 Essential (primary) hypertension: Secondary | ICD-10-CM | POA: Diagnosis not present

## 2016-12-04 DIAGNOSIS — F0280 Dementia in other diseases classified elsewhere without behavioral disturbance: Secondary | ICD-10-CM

## 2016-12-04 DIAGNOSIS — E034 Atrophy of thyroid (acquired): Secondary | ICD-10-CM | POA: Diagnosis not present

## 2016-12-04 NOTE — Progress Notes (Signed)
Location:  [place] Room Number: [id number] Place of Service:  SNF (31)  Annette [name]. Annette Coil, MD  No care team member to display  Extended Emergency Contact Information Primary Emergency Contact: [facility] Address: [address]          [place], [place] [address] Annette [name] of [place] Phone: [phone] Mobile Phone: [phone] Relation: Son Secondary Emergency Contact: Annette [name] Address: [address]          [place], [place] [address] [place] of [place] Phone: [phone] Mobile Phone: [phone] Relation: Relative    Allergies: Contrast media [iodinated diagnostic agents]; Beta adrenergic blockers; Ciprofloxacin; Combigan [brimonidine tartrate-timolol]; Iodine; Moxifloxacin; Sulfa antibiotics; and Tetracyclines & related  Chief Complaint  Patient presents with  . Medical Management of Chronic Issues    Routine Visit    HPI: Patient is 83 y.o. female who is being seen for routine issues of hypothyroidism, HTN and dementia.  Past Medical History:  Diagnosis Date  . Bladder cancer metastasized to pelvic region [facility])   . COPD (chronic obstructive pulmonary disease) ([place])   . Dementia without behavioral disturbance   . Hypertension   . Hypothyroid     Past Surgical History:  Procedure Laterality Date  . EYE SURGERY     cataract  . URETERAL STENT PLACEMENT Right    2/2 bladder CA  . vaginal tumor     removed in remote past    Allergies as of 12/04/2016      Reactions   Contrast Media [iodinated Diagnostic Agents] Hives   Beta Adrenergic Blockers Other (See Comments)   Low heart rate    Ciprofloxacin    Combigan [brimonidine Tartrate-timolol]    Iodine    Moxifloxacin    Other reaction(s): Dizziness (intolerance)   Sulfa Antibiotics    Tetracyclines & Related       Medication List       Accurate as of 12/04/16 11:59 PM. Always use your most recent med list.          acetaminophen 325 MG  tablet Commonly known as:  TYLENOL Take 650 mg by mouth every 6 (six) hours as needed for mild pain, moderate pain or fever.   albuterol (2.5 MG/3ML) 0.083% nebulizer solution Commonly known as:  PROVENTIL Take 2.5 mg by nebulization every 4 (four) hours as needed for shortness of breath.   amLODipine 10 MG tablet Commonly known as:  NORVASC Take 10 mg by mouth daily.   bimatoprost 0.01 % Soln Commonly known as:  LUMIGAN Place 1 drop into both eyes at bedtime.   docusate sodium 100 MG capsule Commonly known as:  COLACE Take 100 mg by mouth 2 (two) times daily.   donepezil 5 MG tablet Commonly known as:  ARICEPT Take 5 mg by mouth daily.   levothyroxine 50 MCG tablet Commonly known as:  SYNTHROID, LEVOTHROID Take 50 mcg by mouth daily before breakfast.   NUTRITIONAL SUPPLEMENT PO Offer Magic Cup to lunch/dinner tray   OXYGEN Inhale 2-3 L into the lungs continuous. To keep sats above 90%   PRESERVISION AREDS 2 Caps Take 1 capsule by mouth 2 (two) times daily.       No orders of the defined types were placed in this encounter.   Immunization History  Administered Date(s) Administered  . Influenza-Unspecified 07/06/2015  . PPD Test 12/29/2015, 01/09/2016  . Pneumococcal-Unspecified 07/06/2015    Social History  Substance Use Topics  . Smoking status: Former Smoker  Packs/day: 0.50    Years: 60.00    Types: Cigarettes  . Smokeless tobacco: Never Used  . Alcohol use No    Review of Systems  DATA OBTAINED: from patient, nurse GENERAL:  no fevers, fatigue, appetite changes SKIN: No itching, rash HEENT: No complaint RESPIRATORY: No cough, wheezing, SOB CARDIAC: No chest pain, palpitations, lower extremity edema  GI: No abdominal pain, No N/V/D or constipation, No heartburn or reflux  GU: No dysuria, frequency or urgency, or incontinence  MUSCULOSKELETAL: No unrelieved bone/joint pain NEUROLOGIC: No headache, dizziness  PSYCHIATRIC: No overt anxiety or  sadness  Vitals:   12/04/16 1049  BP: 101/70  Pulse: (!) 58  Resp: 16  Temp: 97.5 F (36.4 C)   Body mass index is 18.9 kg/m. Physical Exam  GENERAL APPEARANCE: Alert, conversant, No acute distress  SKIN: No diaphoresis rash HEENT: Unremarkable RESPIRATORY: Breathing is even, unlabored. Lung sounds are clear   CARDIOVASCULAR: Heart RRR no murmurs, rubs or gallops. No peripheral edema  GASTROINTESTINAL: Abdomen is soft, non-tender, not distended w/ normal bowel sounds.  GENITOURINARY: Bladder non tender, not distended  MUSCULOSKELETAL: No abnormal joints or musculature NEUROLOGIC: Cranial nerves 2-12 grossly intact. Moves all extremities PSYCHIATRIC: Mood and affect appropriate to situation with dementia, no behavioral issues  Patient Active Problem List   Diagnosis Date Noted  . Chronic respiratory failure with hypoxia ([place]) 11/18/2016  . Macular degeneration, wet ([place]) 09/15/2016  . HCAP (healthcare-associated pneumonia) 08/07/2016  . Constipation 08/02/2016  . Renal insufficiency 04/05/2016  . Sepsis ([place]) 12/17/2015  . Pulmonary nodule, left 12/17/2015  . UTI (urinary tract infection) 12/17/2015  . Insomnia 12/17/2015  . Edema 05/02/2014  . HTN (hypertension) 05/02/2014  . AAA (abdominal aortic aneurysm) ([place]) 04/24/2014  . DDD (degenerative disc disease), lumbar 04/24/2014  . Encephalopathy, metabolic 99991111  . Hypertensive urgency 04/24/2014  . Dementia without behavioral disturbance   . Bladder cancer metastasized to pelvic region [facility])   . COPD (chronic obstructive pulmonary disease) ([place])   . Hypothyroid   . Hypertension   . Nicotine addiction 11/24/2013  . Lung nodule, multiple 10/01/2013    CMP     Component Value Date/Time   NA 141 11/13/2016   K 4.3 11/13/2016   BUN 49 (A) 11/13/2016   CREATININE 1.4 (A) 11/13/2016   AST 18 09/18/2016   ALT 10 09/18/2016   ALKPHOS 77 09/18/2016    Recent Labs  08/16/16 09/18/16 11/13/16  NA 140 142 141   K 4.1 4.5 4.3  BUN 30* 37* 49*  CREATININE 1.3* 1.7* 1.4*    Recent Labs  02/24/16 09/18/16  AST 18 18  ALT 11 10  ALKPHOS 92 77    Recent Labs  05/16/16 09/18/16 11/13/16  WBC 8.4 7.2 6.4  HGB 5.1* 11.6* 12.5  HCT 13* 36 39  PLT 160 138* 154    Recent Labs  02/24/16 09/18/16  CHOL 177 157  LDLCALC 91 87  TRIG 97 57   No results found for: [facility] Lab Results  Component Value Date   TSH 2.15 09/18/2016   Lab Results  Component Value Date   HGBA1C 5.7 09/18/2016   Lab Results  Component Value Date   CHOL 157 09/18/2016   HDL 59 09/18/2016   LDLCALC 87 09/18/2016   TRIG 57 09/18/2016    Significant Diagnostic Results in last 30 days:  No results found.  Assessment and Plan  Hypothyroid Stable; cont synthroid 50 mcg daily   HTN (hypertension) Controlled on norvasc  10 mg daily; cont current med  Dementia without behavioral disturbance Chronic stable; cont aricept 5 mg daily    Annette [name] D. Annette Coil, MD

## 2016-12-22 ENCOUNTER — Encounter: Payer: Self-pay | Admitting: Internal Medicine

## 2016-12-22 NOTE — Assessment & Plan Note (Signed)
Stable; cont synthroid 50 mcg daily

## 2016-12-22 NOTE — Assessment & Plan Note (Signed)
Controlled on norvasc 10 mg daily; cont current med

## 2016-12-22 NOTE — Assessment & Plan Note (Signed)
Chronic stable; cont aricept 5 mg daily

## 2016-12-24 ENCOUNTER — Non-Acute Institutional Stay (SKILLED_NURSING_FACILITY): Payer: Medicare Other | Admitting: Internal Medicine

## 2016-12-24 DIAGNOSIS — H9202 Otalgia, left ear: Secondary | ICD-10-CM

## 2016-12-25 ENCOUNTER — Encounter: Payer: Self-pay | Admitting: Internal Medicine

## 2016-12-25 NOTE — Progress Notes (Signed)
Location:  [place] Room Number: 308M Place of Service:  SNF (31)  [name], MD  No care team member to display  Extended Emergency Contact Information Primary Emergency Contact: [facility] Address: [address]          [place], [place] [address] Johnnette Litter of [place] Phone: [phone] Mobile Phone: [phone] Relation: Son Secondary Emergency Contact: Sharene Butters Address: [place]          [place], [place] [address] [place] Phone: [phone] Mobile Phone: [phone] Relation: Relative    Allergies: Contrast media [iodinated diagnostic agents]; Beta adrenergic blockers; Ciprofloxacin; Combigan [brimonidine tartrate-timolol]; Iodine; Moxifloxacin; Sulfa antibiotics; and Tetracyclines & related  Chief Complaint  Patient presents with  . Acute Visit    HPI: Patient is 83 y.o. female who is being seen acutely because an outbreak of Influenza A per CDC guidelines was recognized on 11/14/2016. Pt ith COPD exacerbation w/u 2 days ago with neg flu test;therefore pt will need to be prophylaxed with Tamiflu for a minimum of 14 days per CDC protocol.  Past Medical History:  Diagnosis Date  . Bladder cancer metastasized to pelvic region [facility])   . COPD (chronic obstructive pulmonary disease) ([place])   . Dementia without behavioral disturbance   . Hypertension   . Hypothyroid     Past Surgical History:  Procedure Laterality Date  . EYE SURGERY     cataract  . URETERAL STENT PLACEMENT Right    2/2 bladder CA  . vaginal tumor     removed in remote past    Allergies as of 11/14/2016      Reactions   Contrast Media [iodinated Diagnostic Agents] Hives   Beta Adrenergic Blockers Other (See Comments)   Low heart rate    Ciprofloxacin    Combigan [brimonidine Tartrate-timolol]    Iodine    Moxifloxacin    Other reaction(s): Dizziness (intolerance)   Sulfa Antibiotics    Tetracyclines & Related       Medication List       Accurate as of 11/14/16 11:59 PM. Always use your most recent med list.          acetaminophen 325 MG tablet Commonly known as:  TYLENOL Take 650 mg by mouth every 6 (six) hours as needed for mild pain, moderate pain or fever.   albuterol (2.5 MG/3ML) 0.083% nebulizer solution Commonly known as:  PROVENTIL Take 2.5 mg by nebulization every 4 (four) hours as needed for shortness of breath.   amLODipine 10 MG tablet Commonly known as:  NORVASC Take 10 mg by mouth daily.   bimatoprost 0.01 % Soln Commonly known as:  LUMIGAN Place 1 drop into both eyes at bedtime.   docusate sodium 100 MG capsule Commonly known as:  COLACE Take 100 mg by mouth 2 (two) times daily.   donepezil 5 MG tablet Commonly known as:  ARICEPT Take 5 mg by mouth daily.   levothyroxine 50 MCG tablet Commonly known as:  SYNTHROID, LEVOTHROID Take 50 mcg by mouth daily before breakfast.   NUTRITIONAL SUPPLEMENT PO Offer Magic Cup to lunch/dinner tray   OXYGEN Inhale 2-3 L into the lungs continuous. To keep sats above 90%   PRESERVISION AREDS 2 Caps Take 1 capsule by mouth 2 (two) times daily.       No orders of the defined types were placed in this encounter.   Immunization History  Administered Date(s) Administered  . Influenza-Unspecified 07/06/2015  .  PPD Test 12/29/2015, 01/09/2016  . Pneumococcal-Unspecified 07/06/2015    Social History  Substance Use Topics  . Smoking status: Former Smoker    Packs/day: 0.50    Years: 60.00    Types: Cigarettes  . Smokeless tobacco: Never Used  . Alcohol use No    Review of Systems  DATA OBTAINED: from, nurse GENERAL:  no fevers SKIN: No itching, rash HEENT: no rhinorrhea, congestion, ST or ear pain RESPIRATORY: No cough, +wheezing, SOB CARDIAC: No chest pain, palpitations, lower extremity edema  GI: No abdominal pain, No N/V/D or constipation, No heartburn or reflux  MUSCULOSKELETAL: No muscle aches NEUROLOGIC:  No headache, dizziness   Vitals:   11/14/16 1245  BP: 101/70  Pulse: 74  Resp: (!) 22  Temp: 97.5 F (36.4 C)   Body mass index is 19.04 kg/m. Physical Exam  GENERAL APPEARANCE: Alert,No acute distress  SKIN: No diaphoresis rash HEENT: Unremarkable RESPIRATORY: Breathing is even, unlabored. Lung sounds are wheezing  CARDIOVASCULAR: Heart RRR no murmurs, rubs or gallops. No peripheral edema  GASTROINTESTINAL: Abdomen is soft, non-tender, not distended w/ normal bowel sounds.   NEUROLOGIC: Cranial nerves 2-12 grossly intact PSYCHIATRIC: baseline, no mental status changes  Patient Active Problem List   Diagnosis Date Noted  . Chronic respiratory failure with hypoxia ([place]) 11/18/2016  . Macular degeneration, wet ([place]) 09/15/2016  . HCAP (healthcare-associated pneumonia) 08/07/2016  . Constipation 08/02/2016  . Renal insufficiency 04/05/2016  . Sepsis ([place]) 12/17/2015  . Pulmonary nodule, left 12/17/2015  . UTI (urinary tract infection) 12/17/2015  . Insomnia 12/17/2015  . Edema 05/02/2014  . HTN (hypertension) 05/02/2014  . AAA (abdominal aortic aneurysm) ([place]) 04/24/2014  . DDD (degenerative disc disease), lumbar 04/24/2014  . Encephalopathy, metabolic 72/06/4708  . Hypertensive urgency 04/24/2014  . Dementia without behavioral disturbance   . Bladder cancer metastasized to pelvic region [facility])   . COPD (chronic obstructive pulmonary disease) ([place])   . Hypothyroid   . Hypertension   . Nicotine addiction 11/24/2013  . Lung nodule, multiple 10/01/2013    CMP     Component Value Date/Time   NA 141 11/13/2016   K 4.3 11/13/2016   BUN 49 (A) 11/13/2016   CREATININE 1.4 (A) 11/13/2016   AST 18 09/18/2016   ALT 10 09/18/2016   ALKPHOS 77 09/18/2016    Recent Labs  08/16/16 09/18/16 11/13/16  NA 140 142 141  K 4.1 4.5 4.3  BUN 30* 37* 49*  CREATININE 1.3* 1.7* 1.4*    Recent Labs  02/24/16 09/18/16  AST 18 18  ALT 11 10  ALKPHOS 92 77    Recent Labs   05/16/16 09/18/16 11/13/16  WBC 8.4 7.2 6.4  HGB 5.1* 11.6* 12.5  HCT 13* 36 39  PLT 160 138* 154    Recent Labs  02/24/16 09/18/16  CHOL 177 157  LDLCALC 91 87  TRIG 97 57   No results found for: [facility] Lab Results  Component Value Date   TSH 2.15 09/18/2016   Lab Results  Component Value Date   HGBA1C 5.7 09/18/2016   Lab Results  Component Value Date   CHOL 157 09/18/2016   HDL 59 09/18/2016   LDLCALC 87 09/18/2016   TRIG 57 09/18/2016    Significant Diagnostic Results in last 30 days:  No results found.  Assessment and Plan  EXPOSURE TO FLU/ INFLUENZA OUTBREAK AT SNF-   CrCl calculated by me-  19.5      Dose for 14 days-  30 mg  q 48 hours Pt will be monitored daily for flu like symptoms                                                                                 Webb Silversmith D. [name], MD

## 2016-12-31 DIAGNOSIS — H3530 Unspecified macular degeneration: Secondary | ICD-10-CM | POA: Insufficient documentation

## 2016-12-31 DIAGNOSIS — H9190 Unspecified hearing loss, unspecified ear: Secondary | ICD-10-CM | POA: Insufficient documentation

## 2016-12-31 DIAGNOSIS — I499 Cardiac arrhythmia, unspecified: Secondary | ICD-10-CM | POA: Insufficient documentation

## 2016-12-31 DIAGNOSIS — J302 Other seasonal allergic rhinitis: Secondary | ICD-10-CM | POA: Insufficient documentation

## 2016-12-31 DIAGNOSIS — I739 Peripheral vascular disease, unspecified: Secondary | ICD-10-CM | POA: Insufficient documentation

## 2016-12-31 DIAGNOSIS — H409 Unspecified glaucoma: Secondary | ICD-10-CM | POA: Insufficient documentation

## 2017-01-02 ENCOUNTER — Encounter: Payer: Self-pay | Admitting: Internal Medicine

## 2017-01-02 ENCOUNTER — Non-Acute Institutional Stay (SKILLED_NURSING_FACILITY): Payer: Medicare Other | Admitting: Internal Medicine

## 2017-01-02 DIAGNOSIS — K5904 Chronic idiopathic constipation: Secondary | ICD-10-CM | POA: Diagnosis not present

## 2017-01-02 DIAGNOSIS — H353290 Exudative age-related macular degeneration, unspecified eye, stage unspecified: Secondary | ICD-10-CM

## 2017-01-02 DIAGNOSIS — J42 Unspecified chronic bronchitis: Secondary | ICD-10-CM | POA: Diagnosis not present

## 2017-01-02 NOTE — Progress Notes (Signed)
Location:  [place] Room Number: 939Q Place of Service:  SNF [phone])  Inocencio Homes, MD  Patient Care Team: Hennie Duos, MD as PCP - General (Internal Medicine)  Extended Emergency Contact Information Primary Emergency Contact: [facility] Address: [address]          [place], [place] [address] Johnnette Litter of [place] Phone: [phone] Mobile Phone: [phone] Relation: Son Secondary Emergency Contact: Sharene Butters Address: [place]          [place], [place] [address] [place] of [place] Phone: [phone] Mobile Phone: [phone] Relation: Relative    Allergies: Contrast media [iodinated diagnostic agents]; Beta adrenergic blockers; Ciprofloxacin; Combigan [brimonidine tartrate-timolol]; Iodine; Moxifloxacin; Sulfa antibiotics; and Tetracyclines & related  Chief Complaint  Patient presents with  . Medical Management of Chronic Issues    Routine Visit    HPI: Patient is 83 y.o. female who is being seen for routine issues of constipation, COPD and wet macular degeneration.  Past Medical History:  Diagnosis Date  . AAA (abdominal aortic aneurysm) ([place]) 03/04/2007   small fusiform infrarenal  AAA catherization report  . Anesthesia complication 38/4536   delayed emergence  [facility]  . Arrhythmia    "occasional missed beat"  . Bladder cancer metastasized to pelvic region [facility])   . CAD (coronary artery disease) 03/04/2007   high grade stenosis ostium of the first diagonal with mild disease other vessels  . COPD (chronic obstructive pulmonary disease) ([place])   . DDD (degenerative disc disease), lumbar    back surgery  x2 last ? late 1980's  . Decubitus ulcer of coccyx    stage 1 coccyx  . Dementia without behavioral disturbance   . Glaucoma, both eyes   . History of chemotherapy   . HOH (hard of hearing)    bilateral hearing aids  . Hypertension   . Hypothyroid 10/12/2013  . Macular degeneration of  both eyes   . Neuropathy ([place])   . PAD (peripheral artery disease) ([place])   . Pulmonary nodules 10/01/2013   PET scan  . Seasonal allergies   . Thyroid disease 85   since age 54  . TIA (transient ischemic attack)    ? expressive aphasia with "difficulty finding words" only x 1 episode 6 months ago; person seen by son no pcp following ; no work up done  . Tobacco abuse    smoked 1/2 to 1.5 x 50 years ; currently 8 cigs / daily  . Vaginal bleeding     Past Surgical History:  Procedure Laterality Date  . ABDOMINAL HYSTERECTOMY    . APPENDECTOMY    . BACK SURGERY      x 2 ( lumbar discectomy )  . BLADDER SURGERY     01/2013 ; 07/2013 TURBT Dr. Estill Dooms  . CARDIAC CATHETERIZATION  03/04/2007  . CATARACT EXTRACTION    . COLONOSCOPY      2009 ?  Marland Kitchen CYSTOSCOPY WITH RETROGRADE PYELOGRAM, URETEROSCOPY AND STENT PLACEMENT  11/06/2013  . SKIN BIOPSY    . TONSILLECTOMY    . URETERAL STENT PLACEMENT Right    2/2 bladder CA  . VAGINAL MASS EXCISION  11/06/2013  . vaginal tumor     removed in remote past    Allergies as of 01/02/2017      Reactions   Contrast Media [iodinated Diagnostic Agents] Hives   Beta Adrenergic Blockers Other (See Comments)   Low heart rate    Ciprofloxacin  Combigan [brimonidine Tartrate-timolol]    Iodine    Moxifloxacin    Other reaction(s): Dizziness (intolerance)   Sulfa Antibiotics    Tetracyclines & Related       Medication List       Accurate as of 01/02/17 11:59 PM. Always use your most recent med list.          acetaminophen 325 MG tablet Commonly known as:  TYLENOL Take 650 mg by mouth every 6 (six) hours as needed for mild pain, moderate pain or fever.   albuterol (2.5 MG/3ML) 0.083% nebulizer solution Commonly known as:  PROVENTIL Take 2.5 mg by nebulization every 4 (four) hours as needed for shortness of breath.   amLODipine 10 MG tablet Commonly known as:  NORVASC Take 10 mg by mouth daily.   bimatoprost 0.01 % Soln Commonly  known as:  LUMIGAN Place 1 drop into both eyes at bedtime.   docusate sodium 100 MG capsule Commonly known as:  COLACE Take 100 mg by mouth 2 (two) times daily.   donepezil 5 MG tablet Commonly known as:  ARICEPT Take 5 mg by mouth daily.   levothyroxine 50 MCG tablet Commonly known as:  SYNTHROID, LEVOTHROID Take 50 mcg by mouth daily before breakfast.   NUTRITIONAL SUPPLEMENT PO Offer Magic Cup to lunch/dinner tray   OXYGEN Inhale 2-3 L into the lungs continuous. To keep sats above 90%   PRESERVISION AREDS 2 Caps Take 1 capsule by mouth 2 (two) times daily.       No orders of the defined types were placed in this encounter.   Immunization History  Administered Date(s) Administered  . Influenza-Unspecified 07/06/2015  . PPD Test 12/29/2015, 01/09/2016  . Pneumococcal-Unspecified 07/06/2015    Social History  Substance Use Topics  . Smoking status: Former Smoker    Packs/day: 0.50    Years: 60.00    Types: Cigarettes  . Smokeless tobacco: Never Used  . Alcohol use No    Review of Systems  DATA OBTAINED: from patient- pt can participate minimally; nurse- no concerns GENERAL:  no fevers, fatigue, appetite changes SKIN: No itching, rash HEENT: No complaint RESPIRATORY: No cough, wheezing, SOB CARDIAC: No chest pain, palpitations, lower extremity edema  GI: No abdominal pain, No N/V/D or constipation, No heartburn or reflux  GU: No dysuria, frequency or urgency, or incontinence  MUSCULOSKELETAL: No unrelieved bone/joint pain NEUROLOGIC: No headache, dizziness  PSYCHIATRIC: No overt anxiety or sadness  Vitals:   01/02/17 0952  BP: 101/70  Pulse: (!) 57  Resp: 18  Temp: 97.5 F (36.4 C)   Body mass index is 18.67 kg/m. Physical Exam  GENERAL APPEARANCE: Alert, min conversant, No acute distress  SKIN: No diaphoresis rash HEENT: Unremarkable RESPIRATORY: Breathing is even, unlabored. Lung sounds are clear   CARDIOVASCULAR: Heart RRR no murmurs,  rubs or gallops. No peripheral edema  GASTROINTESTINAL: Abdomen is soft, non-tender, not distended w/ normal bowel sounds.  GENITOURINARY: Bladder non tender, not distended  MUSCULOSKELETAL: No abnormal joints or musculature NEUROLOGIC: Cranial nerves 2-12 grossly intact. Moves all extremities PSYCHIATRIC: Mood and affect dementia; no behavioral issues  Patient Active Problem List   Diagnosis Date Noted  . Glaucoma, both eyes   . Macular degeneration of both eyes   . PAD (peripheral artery disease) ([place])   . Seasonal allergies   . HOH (hard of hearing)   . Arrhythmia   . Chronic respiratory failure with hypoxia ([place]) 11/18/2016  . Macular degeneration, wet ([place]) 09/15/2016  . HCAP (  healthcare-associated pneumonia) 08/07/2016  . Constipation 08/02/2016  . Renal insufficiency 04/05/2016  . Sepsis ([place]) 12/17/2015  . Pulmonary nodule, left 12/17/2015  . UTI (urinary tract infection) 12/17/2015  . Insomnia 12/17/2015  . Edema 05/02/2014  . HTN (hypertension) 05/02/2014  . AAA (abdominal aortic aneurysm) ([place]) 04/24/2014  . DDD (degenerative disc disease), lumbar 04/24/2014  . Encephalopathy, metabolic 74/09/8785  . Hypertensive urgency 04/24/2014  . Dementia without behavioral disturbance   . Bladder cancer metastasized to pelvic region [facility])   . COPD (chronic obstructive pulmonary disease) ([place])   . Hypothyroid   . Hypertension   . Nicotine addiction 11/24/2013  . Lung nodule, multiple 10/01/2013    CMP     Component Value Date/Time   NA 141 11/13/2016   K 4.3 11/13/2016   BUN 49 (A) 11/13/2016   CREATININE 1.4 (A) 11/13/2016   AST 18 09/18/2016   ALT 10 09/18/2016   ALKPHOS 77 09/18/2016    Recent Labs  08/16/16 09/18/16 11/13/16  NA 140 142 141  K 4.1 4.5 4.3  BUN 30* 37* 49*  CREATININE 1.3* 1.7* 1.4*    Recent Labs  02/24/16 09/18/16  AST 18 18  ALT 11 10  ALKPHOS 92 77    Recent Labs  05/16/16 09/18/16 11/13/16  WBC 8.4 7.2 6.4  HGB 5.1* 11.6*  12.5  HCT 13* 36 39  PLT 160 138* 154    Recent Labs  02/24/16 09/18/16  CHOL 177 157  LDLCALC 91 87  TRIG 97 57   No results found for: [facility] Lab Results  Component Value Date   TSH 2.15 09/18/2016   Lab Results  Component Value Date   HGBA1C 5.7 09/18/2016   Lab Results  Component Value Date   CHOL 157 09/18/2016   HDL 59 09/18/2016   LDLCALC 87 09/18/2016   TRIG 57 09/18/2016    Significant Diagnostic Results in last 30 days:  No results found.  Assessment and Plan  Constipation No c/o ; cont colace 100 mg BID  COPD (chronic obstructive pulmonary disease) No exacerbations reported; plan to cont chronic O2 and prn alburerol  Macular degeneration, wet (HCC) R eye is receiving treament, L eye is endstage; plan to cont preservision areds 2 BID    [name], MD

## 2017-01-03 ENCOUNTER — Encounter: Payer: Self-pay | Admitting: Internal Medicine

## 2017-01-12 ENCOUNTER — Encounter: Payer: Self-pay | Admitting: Internal Medicine

## 2017-01-12 NOTE — Assessment & Plan Note (Signed)
R eye is receiving treament, L eye is endstage; plan to cont preservision areds 2 BID

## 2017-01-12 NOTE — Progress Notes (Signed)
Location:  [place] of Service:  SNF ([id number]  Inocencio Homes, MD  Patient Care Team: Hennie Duos, MD as PCP - General (Internal Medicine)  Extended Emergency Contact Information Primary Emergency Contact: [facility] Address: [address]          [place], [place] [address] Johnnette Litter of [place] Phone: [phone] Mobile Phone: [phone] Relation: Son Secondary Emergency Contact: Sharene Butters Address: [address]          [place], [place] [address] [place] Phone: [phone] Mobile Phone: [phone] Relation: Relative    Allergies: Contrast media [iodinated diagnostic agents]; Beta adrenergic blockers; Ciprofloxacin; Combigan [brimonidine tartrate-timolol]; Iodine; Moxifloxacin; Sulfa antibiotics; and Tetracyclines & related  Chief Complaint  Patient presents with  . Acute Visit    HPI: Patient is 83 y.o. female who is being seen acutely for c/o L ear pain. Pt denies cold, congestion, cough or fever. Pt denies any tooth pain or problems.  Past Medical History:  Diagnosis Date  . AAA (abdominal aortic aneurysm) ([place]) 03/04/2007   small fusiform infrarenal  AAA catherization report  . Anesthesia complication 95/1884   delayed emergence  [facility]  . Arrhythmia    "occasional missed beat"  . Bladder cancer metastasized to pelvic region [facility])   . CAD (coronary artery disease) 03/04/2007   high grade stenosis ostium of the first diagonal with mild disease other vessels  . COPD (chronic obstructive pulmonary disease) ([place])   . DDD (degenerative disc disease), lumbar    back surgery  x2 last ? late 1980's  . Decubitus ulcer of coccyx    stage 1 coccyx  . Dementia without behavioral disturbance   . Glaucoma, both eyes   . History of chemotherapy   . HOH (hard of hearing)    bilateral hearing aids  . Hypertension   . Hypothyroid 10/12/2013  . Macular degeneration of both eyes   .  Neuropathy ([place])   . PAD (peripheral artery disease) ([place])   . Pulmonary nodules 10/01/2013   PET scan  . Seasonal allergies   . Thyroid disease 3   since age 55  . TIA (transient ischemic attack)    ? expressive aphasia with "difficulty finding words" only x 1 episode 6 months ago; person seen by son no pcp following ; no work up done  . Tobacco abuse    smoked 1/2 to 1.5 x 50 years ; currently 8 cigs / daily  . Vaginal bleeding     Past Surgical History:  Procedure Laterality Date  . ABDOMINAL HYSTERECTOMY    . APPENDECTOMY    . BACK SURGERY      x 2 ( lumbar discectomy )  . BLADDER SURGERY     01/2013 ; 07/2013 TURBT Dr. Estill Dooms  . CARDIAC CATHETERIZATION  03/04/2007  . CATARACT EXTRACTION    . COLONOSCOPY      2009 ?  Marland Kitchen CYSTOSCOPY WITH RETROGRADE PYELOGRAM, URETEROSCOPY AND STENT PLACEMENT  11/06/2013  . SKIN BIOPSY    . TONSILLECTOMY    . URETERAL STENT PLACEMENT Right    2/2 bladder CA  . VAGINAL MASS EXCISION  11/06/2013  . vaginal tumor     removed in remote past    Allergies as of 12/24/2016      Reactions   Contrast Media [iodinated Diagnostic Agents] Hives   Beta Adrenergic Blockers Other (See Comments)   Low heart rate    Ciprofloxacin  Combigan [brimonidine Tartrate-timolol]    Iodine    Moxifloxacin    Other reaction(s): Dizziness (intolerance)   Sulfa Antibiotics    Tetracyclines & Related       Medication List       Accurate as of 12/24/16 11:59 PM. Always use your most recent med list.          acetaminophen 325 MG tablet Commonly known as:  TYLENOL Take 650 mg by mouth every 6 (six) hours as needed for mild pain, moderate pain or fever.   albuterol (2.5 MG/3ML) 0.083% nebulizer solution Commonly known as:  PROVENTIL Take 2.5 mg by nebulization every 4 (four) hours as needed for shortness of breath.   amLODipine 10 MG tablet Commonly known as:  NORVASC Take 10 mg by mouth daily.   bimatoprost 0.01 % Soln Commonly known as:   LUMIGAN Place 1 drop into both eyes at bedtime.   docusate sodium 100 MG capsule Commonly known as:  COLACE Take 100 mg by mouth 2 (two) times daily.   donepezil 5 MG tablet Commonly known as:  ARICEPT Take 5 mg by mouth daily.   levothyroxine 50 MCG tablet Commonly known as:  SYNTHROID, LEVOTHROID Take 50 mcg by mouth daily before breakfast.   NUTRITIONAL SUPPLEMENT PO Offer Magic Cup to lunch/dinner tray   OXYGEN Inhale 2-3 L into the lungs continuous. To keep sats above 90%   PRESERVISION AREDS 2 Caps Take 1 capsule by mouth 2 (two) times daily.       No orders of the defined types were placed in this encounter.   Immunization History  Administered Date(s) Administered  . Influenza-Unspecified 07/06/2015  . PPD Test 12/29/2015, 01/09/2016  . Pneumococcal-Unspecified 07/06/2015    Social History  Substance Use Topics  . Smoking status: Former Smoker    Packs/day: 0.50    Years: 60.00    Types: Cigarettes  . Smokeless tobacco: Never Used  . Alcohol use No    Review of Systems  DATA OBTAINED: from patient- can't fully participate;nurse as per HPI GENERAL:  no fevers, fatigue, appetite changes SKIN: No itching, rash HEENT: as per HPI RESPIRATORY: No cough, wheezing, SOB CARDIAC: No chest pain, palpitations, lower extremity edema  GI: No abdominal pain, No N/V/D or constipation, No heartburn or reflux  GU: No dysuria, frequency or urgency, or incontinence  MUSCULOSKELETAL: No unrelieved bone/joint pain NEUROLOGIC: No headache, dizziness  PSYCHIATRIC: No overt anxiety or sadness  Vitals:   01/12/17 1315  BP: 101/70  Pulse: (!) 58  Resp: 16  Temp: 97.5 F (36.4 C)   There is no height or weight on file to calculate BMI. Physical Exam  GENERAL APPEARANCE: Alert, mod conversant, No acute distress  SKIN: No diaphoresis rash HEENT: L ear - outer ear normal, no redness or swellling anywhere; to pain to manipulation of canalTM is normal RESPIRATORY:  Breathing is even, unlabored. Lung sounds are clear   CARDIOVASCULAR: Heart RRR no murmurs, rubs or gallops. No peripheral edema  GASTROINTESTINAL: Abdomen is soft, non-tender, not distended w/ normal bowel sounds.  GENITOURINARY: Bladder non tender, not distended  MUSCULOSKELETAL: No abnormal joints or musculature NEUROLOGIC: Cranial nerves 2-12 grossly intact. Moves all extremities PSYCHIATRIC: flat affect, ornery, no behavioral issues  Patient Active Problem List   Diagnosis Date Noted  . Glaucoma, both eyes   . Macular degeneration of both eyes   . PAD (peripheral artery disease) ([place])   . Seasonal allergies   . HOH (hard of hearing)   .  Arrhythmia   . Chronic respiratory failure with hypoxia ([place]) 11/18/2016  . Macular degeneration, wet ([place]) 09/15/2016  . HCAP (healthcare-associated pneumonia) 08/07/2016  . Constipation 08/02/2016  . Renal insufficiency 04/05/2016  . Sepsis ([place]) 12/17/2015  . Pulmonary nodule, left 12/17/2015  . UTI (urinary tract infection) 12/17/2015  . Insomnia 12/17/2015  . Edema 05/02/2014  . HTN (hypertension) 05/02/2014  . AAA (abdominal aortic aneurysm) ([place]) 04/24/2014  . DDD (degenerative disc disease), lumbar 04/24/2014  . Encephalopathy, metabolic 35/36/1443  . Hypertensive urgency 04/24/2014  . Dementia without behavioral disturbance   . Bladder cancer metastasized to pelvic region [facility])   . COPD (chronic obstructive pulmonary disease) ([place])   . Hypothyroid   . Hypertension   . Nicotine addiction 11/24/2013  . Lung nodule, multiple 10/01/2013    CMP     Component Value Date/Time   NA 141 11/13/2016   K 4.3 11/13/2016   BUN 49 (A) 11/13/2016   CREATININE 1.4 (A) 11/13/2016   AST 18 09/18/2016   ALT 10 09/18/2016   ALKPHOS 77 09/18/2016    Recent Labs  08/16/16 09/18/16 11/13/16  NA 140 142 141  K 4.1 4.5 4.3  BUN 30* 37* 49*  CREATININE 1.3* 1.7* 1.4*    Recent Labs  02/24/16 09/18/16  AST 18 18  ALT 11 10  ALKPHOS 92  77    Recent Labs  05/16/16 09/18/16 11/13/16  WBC 8.4 7.2 6.4  HGB 5.1* 11.6* 12.5  HCT 13* 36 39  PLT 160 138* 154    Recent Labs  02/24/16 09/18/16  CHOL 177 157  LDLCALC 91 87  TRIG 97 57   No results found for: [facility] Lab Results  Component Value Date   TSH 2.15 09/18/2016   Lab Results  Component Value Date   HGBA1C 5.7 09/18/2016   Lab Results  Component Value Date   CHOL 157 09/18/2016   HDL 59 09/18/2016   LDLCALC 87 09/18/2016   TRIG 57 09/18/2016    Significant Diagnostic Results in last 30 days:  No results found.  Assessment and Plan   EVAL FOR C/O L EAR PAIN- exam and hx benign; will cont to monitor   Inocencio Homes, MD

## 2017-01-12 NOTE — Assessment & Plan Note (Signed)
No exacerbations reported; plan to cont chronic O2 and prn alburerol

## 2017-01-12 NOTE — Assessment & Plan Note (Signed)
No c/o ; cont colace 100 mg BID

## 2017-02-06 ENCOUNTER — Encounter: Payer: Self-pay | Admitting: Internal Medicine

## 2017-02-06 ENCOUNTER — Non-Acute Institutional Stay (SKILLED_NURSING_FACILITY): Payer: Medicare Other | Admitting: Internal Medicine

## 2017-02-06 DIAGNOSIS — F0280 Dementia in other diseases classified elsewhere without behavioral disturbance: Secondary | ICD-10-CM | POA: Diagnosis not present

## 2017-02-06 DIAGNOSIS — G301 Alzheimer's disease with late onset: Secondary | ICD-10-CM | POA: Diagnosis not present

## 2017-02-06 DIAGNOSIS — E034 Atrophy of thyroid (acquired): Secondary | ICD-10-CM | POA: Diagnosis not present

## 2017-02-06 DIAGNOSIS — H353221 Exudative age-related macular degeneration, left eye, with active choroidal neovascularization: Secondary | ICD-10-CM | POA: Diagnosis not present

## 2017-02-06 NOTE — Progress Notes (Signed)
Location:  [place] Room Number: 741O Place of Service:  SNF [phone])  Hennie Duos, MD  Patient Care Team: Hennie Duos, MD as PCP - General (Internal Medicine)  Extended Emergency Contact Information Primary Emergency Contact: [facility] Address: [address]          [place], [place] [address] Johnnette Litter of [place] Phone: [phone] Mobile Phone: [phone] Relation: Son Secondary Emergency Contact: Sharene Butters Address: [address]          [place], [place] [address] [place] Phone: [phone] Mobile Phone: [phone] Relation: Relative    Allergies: Contrast media [iodinated diagnostic agents]; Beta adrenergic blockers; Ciprofloxacin; Combigan [brimonidine tartrate-timolol]; Iodine; Moxifloxacin; Sulfa antibiotics; and Tetracyclines & related  Chief Complaint  Patient presents with  . Medical Management of Chronic Issues    Routine Visit    HPI: Patient is 83 y.o. female who is being seen for routine issues of macular degeneration, dementia and hypothyroidism.  Past Medical History:  Diagnosis Date  . AAA (abdominal aortic aneurysm) ([place]) 03/04/2007   small fusiform infrarenal  AAA catherization report  . Anesthesia complication 67/5916   delayed emergence  [facility]  . Arrhythmia    "occasional missed beat"  . Bladder cancer metastasized to pelvic region [facility])   . CAD (coronary artery disease) 03/04/2007   high grade stenosis ostium of the first diagonal with mild disease other vessels  . COPD (chronic obstructive pulmonary disease) ([place])   . DDD (degenerative disc disease), lumbar    back surgery  x2 last ? late 1980's  . Decubitus ulcer of coccyx    stage 1 coccyx  . Dementia without behavioral disturbance   . Glaucoma, both eyes   . History of chemotherapy   . HOH (hard of hearing)    bilateral hearing aids  . Hypertension   . Hypothyroid 10/12/2013  . Macular  degeneration of both eyes   . Neuropathy   . PAD (peripheral artery disease) ([place])   . Pulmonary nodules 10/01/2013   PET scan  . Seasonal allergies   . Thyroid disease 67   since age 6  . TIA (transient ischemic attack)    ? expressive aphasia with "difficulty finding words" only x 1 episode 6 months ago; person seen by son no pcp following ; no work up done  . Tobacco abuse    smoked 1/2 to 1.5 x 50 years ; currently 8 cigs / daily  . Vaginal bleeding     Past Surgical History:  Procedure Laterality Date  . ABDOMINAL HYSTERECTOMY    . APPENDECTOMY    . BACK SURGERY      x 2 ( lumbar discectomy )  . BLADDER SURGERY     01/2013 ; 07/2013 TURBT Dr. Estill Dooms  . CARDIAC CATHETERIZATION  03/04/2007  . CATARACT EXTRACTION    . COLONOSCOPY      2009 ?  Marland Kitchen CYSTOSCOPY WITH RETROGRADE PYELOGRAM, URETEROSCOPY AND STENT PLACEMENT  11/06/2013  . SKIN BIOPSY    . TONSILLECTOMY    . URETERAL STENT PLACEMENT Right    2/2 bladder CA  . VAGINAL MASS EXCISION  11/06/2013  . vaginal tumor     removed in remote past    Allergies as of 02/06/2017      Reactions   Contrast Media [iodinated Diagnostic Agents] Hives   Beta Adrenergic Blockers Other (See Comments)   Low heart rate    Ciprofloxacin  Combigan [brimonidine Tartrate-timolol]    Iodine    Moxifloxacin    Other reaction(s): Dizziness (intolerance)   Sulfa Antibiotics    Tetracyclines & Related       Medication List       Accurate as of 02/06/17 11:59 PM. Always use your most recent med list.          acetaminophen 325 MG tablet Commonly known as:  TYLENOL Take 650 mg by mouth every 6 (six) hours as needed for mild pain, moderate pain or fever.   albuterol (2.5 MG/3ML) 0.083% nebulizer solution Commonly known as:  PROVENTIL Take 2.5 mg by nebulization every 4 (four) hours as needed for shortness of breath.   amLODipine 10 MG tablet Commonly known as:  NORVASC Take 10 mg by mouth daily.   bimatoprost 0.01 %  Soln Commonly known as:  LUMIGAN Place 1 drop into both eyes at bedtime.   docusate sodium 100 MG capsule Commonly known as:  COLACE Take 100 mg by mouth 2 (two) times daily.   donepezil 5 MG tablet Commonly known as:  ARICEPT Take 5 mg by mouth daily.   levothyroxine 50 MCG tablet Commonly known as:  SYNTHROID, LEVOTHROID Take 50 mcg by mouth daily before breakfast.   NUTRITIONAL SUPPLEMENT PO Offer Magic Cup to lunch/dinner tray   OXYGEN Inhale 2-3 L into the lungs continuous. To keep sats above 90%   PRESERVISION AREDS 2 Caps Take 1 capsule by mouth 2 (two) times daily.       No orders of the defined types were placed in this encounter.   Immunization History  Administered Date(s) Administered  . Influenza-Unspecified 08/25/2014, 07/11/2015  . PPD Test 12/29/2015, 01/09/2016  . Pneumococcal Conjugate-13 07/11/2015  . Pneumococcal Polysaccharide-23 08/21/2013  . Pneumococcal-Unspecified 07/06/2015  . Zoster 09/08/2013    Social History  Substance Use Topics  . Smoking status: Former Smoker    Packs/day: 0.50    Years: 60.00    Types: Cigarettes  . Smokeless tobacco: Never Used  . Alcohol use No    Review of Systems  DATA OBTAINED: from patient- can not fully participate;nursing with no new concerns GENERAL:  no fevers, fatigue, appetite changes SKIN: No itching, rash HEENT: No complaint RESPIRATORY: No cough, wheezing, SOB CARDIAC: No chest pain, palpitations, lower extremity edema  GI: No abdominal pain, No N/V/D or constipation, No heartburn or reflux  GU: No dysuria, frequency or urgency, or incontinence  MUSCULOSKELETAL: No unrelieved bone/joint pain NEUROLOGIC: No headache, dizziness  PSYCHIATRIC: No overt anxiety or sadness  Vitals:   02/06/17 0941  BP: 123/76  Pulse: (!) 51  Resp: 16  Temp: (!) 96 F (35.6 C)   Body mass index is 18.47 kg/m. Physical Exam  GENERAL APPEARANCE: Alert,  No acute distress  SKIN: No diaphoresis  rash HEENT: Unremarkable RESPIRATORY: Breathing is even, unlabored. Lung sounds are clear   CARDIOVASCULAR: Heart RRR no murmurs, rubs or gallops. No peripheral edema  GASTROINTESTINAL: Abdomen is soft, non-tender, not distended w/ normal bowel sounds.  GENITOURINARY: Bladder non tender, not distended  MUSCULOSKELETAL: No abnormal joints or musculature NEUROLOGIC: Cranial nerves 2-12 grossly intact. Moves all extremities PSYCHIATRIC: Mood and affect dementia, no behavioral issues  Patient Active Problem List   Diagnosis Date Noted  . Glaucoma, both eyes   . Macular degeneration of both eyes   . PAD (peripheral artery disease) ([place])   . Seasonal allergies   . HOH (hard of hearing)   . Arrhythmia   . Chronic  respiratory failure with hypoxia ([place]) 11/18/2016  . Macular degeneration, age related, exudative ([place]) 09/15/2016  . HCAP (healthcare-associated pneumonia) 08/07/2016  . Constipation 08/02/2016  . Renal insufficiency 04/05/2016  . Sepsis ([place]) 12/17/2015  . Pulmonary nodule, left 12/17/2015  . UTI (urinary tract infection) 12/17/2015  . Insomnia 12/17/2015  . Edema 05/02/2014  . HTN (hypertension) 05/02/2014  . AAA (abdominal aortic aneurysm) ([place]) 04/24/2014  . DDD (degenerative disc disease), lumbar 04/24/2014  . Encephalopathy, metabolic 69/45/0388  . Hypertensive urgency 04/24/2014  . Dementia without behavioral disturbance   . Bladder cancer metastasized to pelvic region [facility])   . COPD (chronic obstructive pulmonary disease) ([place])   . Hypothyroid   . Hypertension   . Nicotine addiction 11/24/2013  . Lung nodule, multiple 10/01/2013    CMP     Component Value Date/Time   NA 137 02/21/2017   K 4.7 02/21/2017   BUN 39 (A) 02/21/2017   CREATININE 1.1 02/21/2017   AST 18 09/18/2016   ALT 10 09/18/2016   ALKPHOS 77 09/18/2016    Recent Labs  09/18/16 11/13/16 02/21/17  NA 142 141 137  K 4.5 4.3 4.7  BUN 37* 49* 39*  CREATININE 1.7* 1.4* 1.1    Recent  Labs  09/18/16  AST 18  ALT 10  ALKPHOS 77    Recent Labs  09/18/16 11/13/16 02/21/17  WBC 7.2 6.4 8.0  HGB 11.6* 12.5 13.1  HCT 36 39 40  PLT 138* 154 137*    Recent Labs  09/18/16  CHOL 157  LDLCALC 87  TRIG 57   No results found for: [facility] Lab Results  Component Value Date   TSH 2.15 09/18/2016   Lab Results  Component Value Date   HGBA1C 5.7 09/18/2016   Lab Results  Component Value Date   CHOL 157 09/18/2016   HDL 59 09/18/2016   LDLCALC 87 09/18/2016   TRIG 57 09/18/2016    Significant Diagnostic Results in last 30 days:  No results found.  Assessment and Plan  Macular degeneration, age related, exudative ([place]) Pt is receiving injections in R eyes currently, L is endstage; cont preservation Areds 1 po BID  Dementia without behavioral disturbance Chronic, no major declines;cont aricept 5 mg daily  Hypothyroid Stable,last TSH 2.15;plan to cont synthroid 50 mcg dailt    [name], MD

## 2017-02-21 LAB — CBC AND DIFFERENTIAL
HCT: 40 % (ref 36–46)
Hemoglobin: 13.1 g/dL (ref 12.0–16.0)
Platelets: 137 10*3/uL — AB (ref 150–399)
WBC: 8 10*3/mL

## 2017-02-21 LAB — BASIC METABOLIC PANEL
BUN: 39 mg/dL — AB (ref 4–21)
Creatinine: 1.1 mg/dL (ref 0.5–1.1)
GLUCOSE: 86 mg/dL
POTASSIUM: 4.7 mmol/L (ref 3.4–5.3)
SODIUM: 137 mmol/L (ref 137–147)

## 2017-03-06 ENCOUNTER — Encounter: Payer: Self-pay | Admitting: Internal Medicine

## 2017-03-06 ENCOUNTER — Non-Acute Institutional Stay (SKILLED_NURSING_FACILITY): Payer: Medicare Other | Admitting: Internal Medicine

## 2017-03-06 DIAGNOSIS — J42 Unspecified chronic bronchitis: Secondary | ICD-10-CM

## 2017-03-06 DIAGNOSIS — H409 Unspecified glaucoma: Secondary | ICD-10-CM | POA: Diagnosis not present

## 2017-03-06 DIAGNOSIS — I10 Essential (primary) hypertension: Secondary | ICD-10-CM

## 2017-03-06 NOTE — Progress Notes (Signed)
Location:  [facility] Room Number: 956O Place of Service:  SNF [phone])  Hennie Duos, MD  Patient Care Team: Hennie Duos, MD as PCP - General (Internal Medicine)  Extended Emergency Contact Information Primary Emergency Contact: [facility] Address: [address]          [place], [place] [address] Johnnette Litter of [place] Phone: [phone] Mobile Phone: [phone] Relation: Son Secondary Emergency Contact: Sharene Butters Address: [address]          [place], [place] [address] [place] Phone: [phone] Mobile Phone: [phone] Relation: Relative    Allergies: Contrast media [iodinated diagnostic agents]; Beta adrenergic blockers; Ciprofloxacin; Combigan [brimonidine tartrate-timolol]; Iodine; Moxifloxacin; Sulfa antibiotics; and Tetracyclines & related  Chief Complaint  Patient presents with  . Medical Management of Chronic Issues    Routine Visit    HPI: Patient is 83 y.o. female who Is being seen for routine issues of glaucoma, COPD, and hypertension.  Past Medical History:  Diagnosis Date  . AAA (abdominal aortic aneurysm) ([place]) 03/04/2007   small fusiform infrarenal  AAA catherization report  . Anesthesia complication 32/9518   delayed emergence  [facility]  . Arrhythmia    "occasional missed beat"  . Bladder cancer metastasized to pelvic region [facility])   . CAD (coronary artery disease) 03/04/2007   high grade stenosis ostium of the first diagonal with mild disease other vessels  . COPD (chronic obstructive pulmonary disease) ([place])   . DDD (degenerative disc disease), lumbar    back surgery  x2 last ? late 1980's  . Decubitus ulcer of coccyx    stage 1 coccyx  . Dementia without behavioral disturbance   . Glaucoma, both eyes   . History of chemotherapy   . HOH (hard of hearing)    bilateral hearing aids  . Hypertension   . Hypothyroid 10/12/2013  . Macular degeneration of both eyes    . Neuropathy   . PAD (peripheral artery disease) ([place])   . Pulmonary nodules 10/01/2013   PET scan  . Seasonal allergies   . Thyroid disease 1   since age 22  . TIA (transient ischemic attack)    ? expressive aphasia with "difficulty finding words" only x 1 episode 6 months ago; person seen by son no pcp following ; no work up done  . Tobacco abuse    smoked 1/2 to 1.5 x 50 years ; currently 8 cigs / daily  . Vaginal bleeding     Past Surgical History:  Procedure Laterality Date  . ABDOMINAL HYSTERECTOMY    . APPENDECTOMY    . BACK SURGERY      x 2 ( lumbar discectomy )  . BLADDER SURGERY     01/2013 ; 07/2013 TURBT Dr. Estill Dooms  . CARDIAC CATHETERIZATION  03/04/2007  . CATARACT EXTRACTION    . COLONOSCOPY      2009 ?  Marland Kitchen CYSTOSCOPY WITH RETROGRADE PYELOGRAM, URETEROSCOPY AND STENT PLACEMENT  11/06/2013  . SKIN BIOPSY    . TONSILLECTOMY    . URETERAL STENT PLACEMENT Right    2/2 bladder CA  . VAGINAL MASS EXCISION  11/06/2013  . vaginal tumor     removed in remote past    Allergies as of 03/06/2017      Reactions   Contrast Media [iodinated Diagnostic Agents] Hives   Beta Adrenergic Blockers Other (See Comments)   Low heart rate    Ciprofloxacin  Combigan [brimonidine Tartrate-timolol]    Iodine    Moxifloxacin    Other reaction(s): Dizziness (intolerance)   Sulfa Antibiotics    Tetracyclines & Related       Medication List       Accurate as of 03/06/17 11:59 PM. Always use your most recent med list.          acetaminophen 325 MG tablet Commonly known as:  TYLENOL Take 650 mg by mouth every 6 (six) hours as needed for mild pain, moderate pain or fever.   albuterol (2.5 MG/3ML) 0.083% nebulizer solution Commonly known as:  PROVENTIL Take 2.5 mg by nebulization every 4 (four) hours as needed for shortness of breath.   amLODipine 10 MG tablet Commonly known as:  NORVASC Take 10 mg by mouth daily.   bimatoprost 0.01 % Soln Commonly known as:   LUMIGAN Place 1 drop into both eyes at bedtime.   docusate sodium 100 MG capsule Commonly known as:  COLACE Take 100 mg by mouth 2 (two) times daily.   donepezil 5 MG tablet Commonly known as:  ARICEPT Take 5 mg by mouth daily.   levothyroxine 50 MCG tablet Commonly known as:  SYNTHROID, LEVOTHROID Take 50 mcg by mouth daily before breakfast.   NUTRITIONAL SUPPLEMENT PO Offer Magic Cup to lunch/dinner tray   OXYGEN Inhale 2-3 L into the lungs continuous. To keep sats above 90%   PRESERVISION AREDS 2 Caps Take 1 capsule by mouth 2 (two) times daily.       No orders of the defined types were placed in this encounter.   Immunization History  Administered Date(s) Administered  . Influenza-Unspecified 08/25/2014, 07/11/2015  . PPD Test 12/29/2015, 01/09/2016  . Pneumococcal Conjugate-13 07/11/2015  . Pneumococcal Polysaccharide-23 08/21/2013  . Pneumococcal-Unspecified 07/06/2015  . Zoster 09/08/2013    Social History  Substance Use Topics  . Smoking status: Former Smoker    Packs/day: 0.50    Years: 60.00    Types: Cigarettes  . Smokeless tobacco: Never Used  . Alcohol use No    Review of Systems  DATA OBTAINED: from patient- limited; per nursing - no concerns GENERAL:  no fevers, fatigue, appetite changes SKIN: No itching, rash HEENT: No complaint RESPIRATORY: No cough, wheezing, SOB CARDIAC: No chest pain, palpitations, lower extremity edema  GI: No abdominal pain, No N/V/D or constipation, No heartburn or reflux  GU: No dysuria, frequency or urgency, or incontinence  MUSCULOSKELETAL: No unrelieved bone/joint pain NEUROLOGIC: No headache, dizziness  PSYCHIATRIC: No overt anxiety or sadness  Vitals:   03/06/17 0941  BP: 123/67  Pulse: (!) 55  Resp: 18  Temp: (!) 96 F (35.6 C)   Body mass index is 19.2 kg/m. Physical Exam  GENERAL APPEARANCE: Alert,  No acute distress  SKIN: No diaphoresis rash HEENT: Unremarkable RESPIRATORY: Breathing is  even, unlabored. Lung sounds are clear   CARDIOVASCULAR: Heart RRR no murmurs, rubs or gallops. No peripheral edema  GASTROINTESTINAL: Abdomen is soft, non-tender, not distended w/ normal bowel sounds.  GENITOURINARY: Bladder non tender, not distended  MUSCULOSKELETAL: No abnormal joints or musculature NEUROLOGIC: Cranial nerves 2-12 grossly intact. Moves all extremities PSYCHIATRIC: Mood and affect with dementia, no behavioral issues  Patient Active Problem List   Diagnosis Date Noted  . Glaucoma, both eyes   . Macular degeneration of both eyes   . PAD (peripheral artery disease) ([place])   . Seasonal allergies   . HOH (hard of hearing)   . Arrhythmia   . Chronic respiratory  failure with hypoxia ([place]) 11/18/2016  . Macular degeneration, age related, exudative ([place]) 09/15/2016  . HCAP (healthcare-associated pneumonia) 08/07/2016  . Constipation 08/02/2016  . Renal insufficiency 04/05/2016  . Sepsis ([place]) 12/17/2015  . Pulmonary nodule, left 12/17/2015  . UTI (urinary tract infection) 12/17/2015  . Insomnia 12/17/2015  . Edema 05/02/2014  . HTN (hypertension) 05/02/2014  . AAA (abdominal aortic aneurysm) ([place]) 04/24/2014  . DDD (degenerative disc disease), lumbar 04/24/2014  . Encephalopathy, metabolic 88/41/6606  . Hypertensive urgency 04/24/2014  . Dementia without behavioral disturbance   . Bladder cancer metastasized to pelvic region [facility])   . COPD (chronic obstructive pulmonary disease) ([place])   . Hypothyroid   . Hypertension   . Nicotine addiction 11/24/2013  . Lung nodule, multiple 10/01/2013    CMP     Component Value Date/Time   NA 137 02/21/2017   K 4.7 02/21/2017   BUN 39 (A) 02/21/2017   CREATININE 1.1 02/21/2017   AST 18 09/18/2016   ALT 10 09/18/2016   ALKPHOS 77 09/18/2016    Recent Labs  09/18/16 11/13/16 02/21/17  NA 142 141 137  K 4.5 4.3 4.7  BUN 37* 49* 39*  CREATININE 1.7* 1.4* 1.1    Recent Labs  09/18/16  AST 18  ALT 10  ALKPHOS 77     Recent Labs  09/18/16 11/13/16 02/21/17  WBC 7.2 6.4 8.0  HGB 11.6* 12.5 13.1  HCT 36 39 40  PLT 138* 154 137*    Recent Labs  09/18/16  CHOL 157  LDLCALC 87  TRIG 57   No results found for: [facility] Lab Results  Component Value Date   TSH 2.15 09/18/2016   Lab Results  Component Value Date   HGBA1C 5.7 09/18/2016   Lab Results  Component Value Date   CHOL 157 09/18/2016   HDL 59 09/18/2016   LDLCALC 87 09/18/2016   TRIG 57 09/18/2016    Significant Diagnostic Results in last 30 days:  No results found.  Assessment and Plan  Glaucoma, both eyes Chronic and stable; continue Lumigan 0.01% drops 1 drop to eye daily at bedtime  COPD (chronic obstructive pulmonary disease) Chronic and stable without medications; plan to continue when necessary albuterol nebs  HTN (hypertension) Controlled; continue Norvasc 10 mg by mouth daily    Chanae Gemma D. [name], MD

## 2017-03-10 ENCOUNTER — Encounter: Payer: Self-pay | Admitting: Internal Medicine

## 2017-03-10 NOTE — Assessment & Plan Note (Signed)
Stable,last TSH 2.15;plan to cont synthroid 50 mcg dailt

## 2017-03-10 NOTE — Assessment & Plan Note (Signed)
Pt is receiving injections in R eyes currently, L is endstage; cont preservation Areds 1 po BID

## 2017-03-10 NOTE — Assessment & Plan Note (Signed)
Chronic, no major declines;cont aricept 5 mg daily

## 2017-04-25 ENCOUNTER — Encounter: Payer: Self-pay | Admitting: Internal Medicine

## 2017-04-25 NOTE — Assessment & Plan Note (Signed)
Chronic and stable; continue Lumigan 0.01% drops 1 drop to eye daily at bedtime

## 2017-04-25 NOTE — Assessment & Plan Note (Signed)
Chronic and stable without medications; plan to continue when necessary albuterol nebs

## 2017-04-25 NOTE — Assessment & Plan Note (Signed)
Controlled; continue Norvasc 10 mg by mouth daily

## 2017-05-08 ENCOUNTER — Non-Acute Institutional Stay (SKILLED_NURSING_FACILITY): Payer: Medicare Other

## 2017-05-08 DIAGNOSIS — Z0000 Encounter for general adult medical examination without abnormal findings: Secondary | ICD-10-CM

## 2017-05-08 NOTE — Patient Instructions (Signed)
[name] , Thank you for taking time to come for your Medicare Wellness Visit. I appreciate your ongoing commitment to your health goals. Please review the following plan we discussed and let me know if I can assist you in the future.   Screening recommendations/referrals: Colonoscopy excluded, pt over age 81 Mammogram excluded, pt over age 40 Bone Density due, ordered Recommended yearly ophthalmology/optometry visit for glaucoma screening and checkup Recommended yearly dental visit for hygiene and checkup  Vaccinations: Influenza vaccine due 2018 fall season Pneumococcal vaccine up to date Tdap vaccine due, ordered Shingles vaccine up to date  Advanced directives: DNR in chart, copies of health care power of attorney and living will are needed   Conditions/risks identified: None  Next appointment: Dr. [name] makes rounds   Preventive Care 62 Years and Older, Female Preventive care refers to lifestyle choices and visits with your health care provider that can promote health and wellness. What does preventive care include?  A yearly physical exam. This is also called an annual well check.  Dental exams once or twice a year.  Routine eye exams. Ask your health care provider how often you should have your eyes checked.  Personal lifestyle choices, including:  Daily care of your teeth and gums.  Regular physical activity.  Eating a healthy diet.  Avoiding tobacco and drug use.  Limiting alcohol use.  Practicing safe sex.  Taking low-dose aspirin every day.  Taking vitamin and mineral supplements as recommended by your health care provider. What happens during an annual well check? The services and screenings done by your health care provider during your annual well check will depend on your age, overall health, lifestyle risk factors, and family history of disease. Counseling  Your health care provider may ask you questions about your:  Alcohol use.  Tobacco  use.  Drug use.  Emotional well-being.  Home and relationship well-being.  Sexual activity.  Eating habits.  History of falls.  Memory and ability to understand (cognition).  Work and work Statistician.  Reproductive health. Screening  You may have the following tests or measurements:  Height, weight, and BMI.  Blood pressure.  Lipid and cholesterol levels. These may be checked every 5 years, or more frequently if you are over 67 years old.  Skin check.  Lung cancer screening. You may have this screening every year starting at age 56 if you have a 30-pack-year history of smoking and currently smoke or have quit within the past 15 years.  Fecal occult blood test (FOBT) of the stool. You may have this test every year starting at age 24.  Flexible sigmoidoscopy or colonoscopy. You may have a sigmoidoscopy every 5 years or a colonoscopy every 10 years starting at age 48.  Hepatitis C blood test.  Hepatitis B blood test.  Sexually transmitted disease (STD) testing.  Diabetes screening. This is done by checking your blood sugar (glucose) after you have not eaten for a while (fasting). You may have this done every 1-3 years.  Bone density scan. This is done to screen for osteoporosis. You may have this done starting at age 68.  Mammogram. This may be done every 1-2 years. Talk to your health care provider about how often you should have regular mammograms. Talk with your health care provider about your test results, treatment options, and if necessary, the need for more tests. Vaccines  Your health care provider may recommend certain vaccines, such as:  Influenza vaccine. This is recommended every year.  Tetanus,  diphtheria, and acellular pertussis (Tdap, Td) vaccine. You may need a Td booster every 10 years.  Zoster vaccine. You may need this after age 18.  Pneumococcal 13-valent conjugate (PCV13) vaccine. One dose is recommended after age 56.  Pneumococcal  polysaccharide (PPSV23) vaccine. One dose is recommended after age 36. Talk to your health care provider about which screenings and vaccines you need and how often you need them. This information is not intended to replace advice given to you by your health care provider. Make sure you discuss any questions you have with your health care provider. Document Released: 10/28/2015 Document Revised: 06/20/2016 Document Reviewed: 08/02/2015 Elsevier Interactive Patient Education  2017 [place] Prevention in the Home Falls can cause injuries. They can happen to people of all ages. There are many things you can do to make your home safe and to help prevent falls. What can I do on the outside of my home?  Regularly fix the edges of walkways and driveways and fix any cracks.  Remove anything that might make you trip as you walk through a door, such as a raised step or threshold.  Trim any bushes or trees on the path to your home.  Use bright outdoor lighting.  Clear any walking paths of anything that might make someone trip, such as rocks or tools.  Regularly check to see if handrails are loose or broken. Make sure that both sides of any steps have handrails.  Any raised decks and porches should have guardrails on the edges.  Have any leaves, snow, or ice cleared regularly.  Use sand or salt on walking paths during winter.  Clean up any spills in your garage right away. This includes oil or grease spills. What can I do in the bathroom?  Use night lights.  Install grab bars by the toilet and in the tub and shower. Do not use towel bars as grab bars.  Use non-skid mats or decals in the tub or shower.  If you need to sit down in the shower, use a plastic, non-slip stool.  Keep the floor dry. Clean up any water that spills on the floor as soon as it happens.  Remove soap buildup in the tub or shower regularly.  Attach bath mats securely with double-sided non-slip rug  tape.  Do not have throw rugs and other things on the floor that can make you trip. What can I do in the bedroom?  Use night lights.  Make sure that you have a light by your bed that is easy to reach.  Do not use any sheets or blankets that are too big for your bed. They should not hang down onto the floor.  Have a firm chair that has side arms. You can use this for support while you get dressed.  Do not have throw rugs and other things on the floor that can make you trip. What can I do in the kitchen?  Clean up any spills right away.  Avoid walking on wet floors.  Keep items that you use a lot in easy-to-reach places.  If you need to reach something above you, use a strong step stool that has a grab bar.  Keep electrical cords out of the way.  Do not use floor polish or wax that makes floors slippery. If you must use wax, use non-skid floor wax.  Do not have throw rugs and other things on the floor that can make you trip. What can I do with  my stairs?  Do not leave any items on the stairs.  Make sure that there are handrails on both sides of the stairs and use them. Fix handrails that are broken or loose. Make sure that handrails are as long as the stairways.  Check any carpeting to make sure that it is firmly attached to the stairs. Fix any carpet that is loose or worn.  Avoid having throw rugs at the top or bottom of the stairs. If you do have throw rugs, attach them to the floor with carpet tape.  Make sure that you have a light switch at the top of the stairs and the bottom of the stairs. If you do not have them, ask someone to add them for you. What else can I do to help prevent falls?  Wear shoes that:  Do not have high heels.  Have rubber bottoms.  Are comfortable and fit you well.  Are closed at the toe. Do not wear sandals.  If you use a stepladder:  Make sure that it is fully opened. Do not climb a closed stepladder.  Make sure that both sides of the  stepladder are locked into place.  Ask someone to hold it for you, if possible.  Clearly mark and make sure that you can see:  Any grab bars or handrails.  First and last steps.  Where the edge of each step is.  Use tools that help you move around (mobility aids) if they are needed. These include:  Canes.  Walkers.  Scooters.  Crutches.  Turn on the lights when you go into a dark area. Replace any light bulbs as soon as they burn out.  Set up your furniture so you have a clear path. Avoid moving your furniture around.  If any of your floors are uneven, fix them.  If there are any pets around you, be aware of where they are.  Review your medicines with your doctor. Some medicines can make you feel dizzy. This can increase your chance of falling. Ask your doctor what other things that you can do to help prevent falls. This information is not intended to replace advice given to you by your health care provider. Make sure you discuss any questions you have with your health care provider. Document Released: 07/28/2009 Document Revised: 03/08/2016 Document Reviewed: 11/05/2014 Elsevier Interactive Patient Education  2017 [employer].

## 2017-05-08 NOTE — Progress Notes (Signed)
Subjective:   Annette [name] is a 83 y.o. female who presents for an Initial Medicare Annual Wellness Visit at [facility]    Objective:    Today's Vitals   05/08/17 1335 05/08/17 1336  BP: 110/65   Pulse: (!) 59   Temp: 98 F (36.7 C)   TempSrc: Oral   SpO2: 90%   Weight: 115 lb (52.2 kg)   Height: 5\' 5"  (1.651 m)   PainSc:  10-Worst pain ever   Body mass index is 19.14 kg/m.   Current Medications (verified) Outpatient Encounter Prescriptions as of 05/08/2017  Medication Sig  . acetaminophen (TYLENOL) 325 MG tablet Take 650 mg by mouth every 6 (six) hours as needed for mild pain, moderate pain or fever.   Marland Kitchen albuterol (PROVENTIL) (2.5 MG/3ML) 0.083% nebulizer solution Take 2.5 mg by nebulization every 4 (four) hours as needed for shortness of breath.  Marland Kitchen amLODipine (NORVASC) 10 MG tablet Take 10 mg by mouth daily.   . bimatoprost (LUMIGAN) 0.01 % SOLN Place 1 drop into both eyes at bedtime.   . docusate sodium (COLACE) 100 MG capsule Take 100 mg by mouth 2 (two) times daily.  Marland Kitchen donepezil (ARICEPT) 5 MG tablet Take 5 mg by mouth daily.   Marland Kitchen levothyroxine (SYNTHROID, LEVOTHROID) 50 MCG tablet Take 50 mcg by mouth daily before breakfast.  . Multiple Vitamins-Minerals (PRESERVISION AREDS 2) CAPS Take 1 capsule by mouth 2 (two) times daily.  . Nutritional Supplements (NUTRITIONAL SUPPLEMENT PO) Offer Magic Cup to lunch/dinner tray  . OXYGEN Inhale 2-3 L into the lungs continuous. To keep sats above 90%   No facility-administered encounter medications on file as of 05/08/2017.     Allergies (verified) Contrast media [iodinated diagnostic agents]; Beta adrenergic blockers; Ciprofloxacin; Combigan [brimonidine tartrate-timolol]; Iodine; Moxifloxacin; Sulfa antibiotics; and Tetracyclines & related   History: Past Medical History:  Diagnosis Date  . AAA (abdominal aortic aneurysm) ([place]) 03/04/2007   small fusiform infrarenal  AAA catherization report  . Anesthesia  complication 25/9563   delayed emergence  [facility]  . Arrhythmia    "occasional missed beat"  . Bladder cancer metastasized to pelvic region [facility])   . CAD (coronary artery disease) 03/04/2007   high grade stenosis ostium of the first diagonal with mild disease other vessels  . COPD (chronic obstructive pulmonary disease) ([place])   . DDD (degenerative disc disease), lumbar    back surgery  x2 last ? late 1980's  . Decubitus ulcer of coccyx    stage 1 coccyx  . Dementia without behavioral disturbance   . Glaucoma, both eyes   . History of chemotherapy   . HOH (hard of hearing)    bilateral hearing aids  . Hypertension   . Hypothyroid 10/12/2013  . Macular degeneration of both eyes   . Neuropathy   . PAD (peripheral artery disease) ([place])   . Pulmonary nodules 10/01/2013   PET scan  . Seasonal allergies   . Stroke ([place])   . Thyroid disease 84   since age 72  . TIA (transient ischemic attack)    ? expressive aphasia with "difficulty finding words" only x 1 episode 6 months ago; person seen by son no pcp following ; no work up done  . Tobacco abuse    smoked 1/2 to 1.5 x 50 years ; currently 8 cigs / daily  . Vaginal bleeding    Past Surgical History:  Procedure Laterality Date  . ABDOMINAL HYSTERECTOMY    .  APPENDECTOMY    . BACK SURGERY      x 2 ( lumbar discectomy )  . BLADDER SURGERY     01/2013 ; 07/2013 TURBT Dr. Estill Dooms  . CARDIAC CATHETERIZATION  03/04/2007  . CATARACT EXTRACTION    . COLONOSCOPY      2009 ?  Marland Kitchen CYSTOSCOPY WITH RETROGRADE PYELOGRAM, URETEROSCOPY AND STENT PLACEMENT  11/06/2013  . SKIN BIOPSY    . TONSILLECTOMY    . URETERAL STENT PLACEMENT Right    2/2 bladder CA  . VAGINAL MASS EXCISION  11/06/2013  . vaginal tumor     removed in remote past   Family History  Problem Relation Age of Onset  . Hypertension Mother   . Cancer Neg Hx   . Anesthesia problems Neg Hx    Social History   Occupational History  . Not on file.   Social  History Main Topics  . Smoking status: Former Smoker    Packs/day: 0.50    Years: 60.00    Types: Cigarettes  . Smokeless tobacco: Never Used  . Alcohol use No  . Drug use: No  . Sexual activity: No    Tobacco Counseling Counseling given: Not Answered   Activities of Daily Living In your present state of health, do you have any difficulty performing the following activities: 05/08/2017  Hearing? Y  Vision? Y  Difficulty concentrating or making decisions? Y  Walking or climbing stairs? Y  Dressing or bathing? Y  Doing errands, shopping? Y  Preparing Food and eating ? Y  Using the Toilet? Y  In the past six months, have you accidently leaked urine? Y  Do you have problems with loss of bowel control? Y  Managing your Medications? Y  Managing your Finances? Y  Housekeeping or managing your Housekeeping? Y  Some recent data might be hidden    Immunizations and Health Maintenance Immunization History  Administered Date(s) Administered  . Influenza-Unspecified 08/25/2014, 07/11/2015  . PPD Test 12/29/2015, 01/09/2016  . Pneumococcal Conjugate-13 07/11/2015  . Pneumococcal Polysaccharide-23 08/21/2013  . Pneumococcal-Unspecified 07/06/2015  . Zoster 09/08/2013   Health Maintenance Due  Topic Date Due  . DEXA SCAN  08/17/1993    Patient Care Team: Hennie Duos, MD as PCP - General (Internal Medicine)  Indicate any recent Medical Services you may have received from other than [facility] providers in the past year (date may be approximate).     Assessment:   This is a routine wellness examination for Annette [name].   Hearing/Vision screen No exam data present  Dietary issues and exercise activities discussed: Current Exercise Habits: The patient does not participate in regular exercise at present, Exercise limited by: neurologic condition(s)  Goals    None     Depression Screen PHQ 2/9 Scores 05/08/2017  PHQ - 2 Score 0    Fall Risk Fall Risk  05/08/2017  Falls in  the past year? No    Cognitive Function:     6CIT Screen 05/08/2017  What Year? 4 points  What month? 3 points  What time? 3 points  Count back from 20 0 points  Months in reverse 4 points  Repeat phrase 10 points  Total Score 24    Screening Tests Health Maintenance  Topic Date Due  . DEXA SCAN  08/17/1993  . TETANUS/TDAP  03/05/2026 (Originally 08/18/1945)  . INFLUENZA VACCINE  05/15/2017  . PNA vac Low Risk Adult  Completed      Plan:     I have  personally reviewed and addressed the Medicare Annual Wellness questionnaire and have noted the following in the patient's chart:  A. Medical and social history B. Use of alcohol, tobacco or illicit drugs  C. Current medications and supplements D. Functional ability and status E.  Nutritional status F.  Physical activity G. Advance directives H. List of other physicians I.  Hospitalizations, surgeries, and ER visits in previous 12 months J.  [place] to include hearing, vision, cognitive, depression L. Referrals and appointments - none  In addition, I have reviewed and discussed with patient certain preventive protocols, quality metrics, and best practice recommendations. A written personalized care plan for preventive services as well as general preventive health recommendations were provided to patient.  See attached scanned questionnaire for additional information.   Signed,   Rich Reining, RN Nurse Health Advisor   Quick Notes   Health Maintenance: TDAP due     Abnormal Screen: 6 CIT-24     Patient Concerns: None     Nurse Concerns: None

## 2017-05-09 ENCOUNTER — Encounter: Payer: Self-pay | Admitting: Internal Medicine

## 2017-05-09 ENCOUNTER — Non-Acute Institutional Stay (SKILLED_NURSING_FACILITY): Payer: Medicare Other | Admitting: Internal Medicine

## 2017-05-09 DIAGNOSIS — I10 Essential (primary) hypertension: Secondary | ICD-10-CM

## 2017-05-09 DIAGNOSIS — G301 Alzheimer's disease with late onset: Secondary | ICD-10-CM | POA: Diagnosis not present

## 2017-05-09 DIAGNOSIS — H353221 Exudative age-related macular degeneration, left eye, with active choroidal neovascularization: Secondary | ICD-10-CM | POA: Diagnosis not present

## 2017-05-09 DIAGNOSIS — F0280 Dementia in other diseases classified elsewhere without behavioral disturbance: Secondary | ICD-10-CM | POA: Diagnosis not present

## 2017-05-09 NOTE — Progress Notes (Signed)
Location:  [facility] Room Number: 086V Place of Service:  SNF [phone])  Hennie Duos, MD  Patient Care Team: Hennie Duos, MD as PCP - General (Internal Medicine)  Extended Emergency Contact Information Primary Emergency Contact: [facility] Address: [address]          [place], [place] [address] [place] of [place] Phone: [phone] Mobile Phone: [phone] Relation: Son Secondary Emergency Contact: Sharene Butters Address: [address]          [place], [place] [address] [place] Phone: [phone] Mobile Phone: [phone] Relation: Relative    Allergies: Contrast media [iodinated diagnostic agents]; Beta adrenergic blockers; Ciprofloxacin; Combigan [brimonidine tartrate-timolol]; Iodine; Moxifloxacin; Sulfa antibiotics; and Tetracyclines & related  Chief Complaint  Patient presents with  . Medical Management of Chronic Issues    routine visit    HPI: Patient is 83 y.o. female who Who is being seen for routine issues of macular degeneration, dementia, and hypertension.  Past Medical History:  Diagnosis Date  . AAA (abdominal aortic aneurysm) ([place]) 03/04/2007   small fusiform infrarenal  AAA catherization report  . Anesthesia complication 84/1660   delayed emergence  [facility]  . Arrhythmia    "occasional missed beat"  . Bladder cancer metastasized to pelvic region [facility])   . CAD (coronary artery disease) 03/04/2007   high grade stenosis ostium of the first diagonal with mild disease other vessels  . COPD (chronic obstructive pulmonary disease) ([place])   . DDD (degenerative disc disease), lumbar    back surgery  x2 last ? late 1980's  . Decubitus ulcer of coccyx    stage 1 coccyx  . Dementia without behavioral disturbance   . Glaucoma, both eyes   . History of chemotherapy   . HOH (hard of hearing)    bilateral hearing aids  . Hypertension   . Hypothyroid 10/12/2013  . Macular  degeneration of both eyes   . Neuropathy   . PAD (peripheral artery disease) ([place])   . Pulmonary nodules 10/01/2013   PET scan  . Seasonal allergies   . Stroke ([place])   . Thyroid disease 20   since age 8  . TIA (transient ischemic attack)    ? expressive aphasia with "difficulty finding words" only x 1 episode 6 months ago; person seen by son no pcp following ; no work up done  . Tobacco abuse    smoked 1/2 to 1.5 x 50 years ; currently 8 cigs / daily  . Vaginal bleeding     Past Surgical History:  Procedure Laterality Date  . ABDOMINAL HYSTERECTOMY    . APPENDECTOMY    . BACK SURGERY      x 2 ( lumbar discectomy )  . BLADDER SURGERY     01/2013 ; 07/2013 TURBT Dr. Estill Dooms  . CARDIAC CATHETERIZATION  03/04/2007  . CATARACT EXTRACTION    . COLONOSCOPY      2009 ?  Marland Kitchen CYSTOSCOPY WITH RETROGRADE PYELOGRAM, URETEROSCOPY AND STENT PLACEMENT  11/06/2013  . SKIN BIOPSY    . TONSILLECTOMY    . URETERAL STENT PLACEMENT Right    2/2 bladder CA  . VAGINAL MASS EXCISION  11/06/2013  . vaginal tumor     removed in remote past    Allergies as of 05/09/2017      Reactions   Contrast Media [iodinated Diagnostic Agents] Hives   Beta Adrenergic Blockers Other (See Comments)   Low heart rate  Ciprofloxacin    Combigan [brimonidine Tartrate-timolol]    Iodine    Moxifloxacin    Other reaction(s): Dizziness (intolerance)   Sulfa Antibiotics    Tetracyclines & Related       Medication List       Accurate as of 05/09/17 11:59 PM. Always use your most recent med list.          acetaminophen 325 MG tablet Commonly known as:  TYLENOL Take 650 mg by mouth every 6 (six) hours as needed for mild pain, moderate pain or fever.   albuterol (2.5 MG/3ML) 0.083% nebulizer solution Commonly known as:  PROVENTIL Take 2.5 mg by nebulization every 4 (four) hours as needed for shortness of breath.   amLODipine 10 MG tablet Commonly known as:  NORVASC Take 10 mg by mouth daily.     bimatoprost 0.01 % Soln Commonly known as:  LUMIGAN Place 1 drop into both eyes at bedtime.   docusate sodium 100 MG capsule Commonly known as:  COLACE Take 100 mg by mouth 2 (two) times daily.   donepezil 5 MG tablet Commonly known as:  ARICEPT Take 5 mg by mouth daily.   levothyroxine 50 MCG tablet Commonly known as:  SYNTHROID, LEVOTHROID Take 50 mcg by mouth daily before breakfast.   NUTRITIONAL SUPPLEMENT PO Offer Magic Cup to lunch/dinner tray   OXYGEN Inhale 2-3 L into the lungs continuous. To keep sats above 90%   PRESERVISION AREDS 2 Caps Take 1 capsule by mouth 2 (two) times daily.       No orders of the defined types were placed in this encounter.   Immunization History  Administered Date(s) Administered  . Influenza-Unspecified 08/25/2014, 07/11/2015  . PPD Test 12/29/2015, 01/09/2016  . Pneumococcal Conjugate-13 07/11/2015  . Pneumococcal Polysaccharide-23 08/21/2013  . Pneumococcal-Unspecified 07/06/2015  . Zoster 09/08/2013    Social History  Substance Use Topics  . Smoking status: Former Smoker    Packs/day: 0.50    Years: 60.00    Types: Cigarettes  . Smokeless tobacco: Never Used  . Alcohol use No    Review of Systems  DATA OBTAINED: from patient-Limited; nurse-no concerns GENERAL:  no fevers, fatigue, appetite changes SKIN: No itching, rash HEENT: No complaint RESPIRATORY: No cough, wheezing, SOB CARDIAC: No chest pain, palpitations, lower extremity edema  GI: No abdominal pain, No N/V/D or constipation, No heartburn or reflux  GU: No dysuria, frequency or urgency, or incontinence  MUSCULOSKELETAL: No unrelieved bone/joint pain NEUROLOGIC: No headache, dizziness  PSYCHIATRIC: No overt anxiety or sadness  Vitals:   05/09/17 1131  BP: 138/73  Pulse: 66  Resp: 18  Temp: (!) 97.3 F (36.3 C)   Body mass index is 19.47 kg/m. Physical Exam  GENERAL APPEARANCE: Alert, conversant, No acute distress  SKIN: No diaphoresis  rash HEENT: Unremarkable RESPIRATORY: Breathing is even, unlabored. Lung sounds are clear   CARDIOVASCULAR: Heart RRR no murmurs, rubs or gallops. No peripheral edema  GASTROINTESTINAL: Abdomen is soft, non-tender, not distended w/ normal bowel sounds.  GENITOURINARY: Bladder non tender, not distended  MUSCULOSKELETAL: No abnormal joints or musculature NEUROLOGIC: Cranial nerves 2-12 grossly intact. Moves all extremities PSYCHIATRIC: Mood and affect with dementia, no behavioral issues  Patient Active Problem List   Diagnosis Date Noted  . Glaucoma, both eyes   . Macular degeneration of both eyes   . PAD (peripheral artery disease) ([place])   . Seasonal allergies   . HOH (hard of hearing)   . Arrhythmia   . Chronic respiratory  failure with hypoxia ([place]) 11/18/2016  . Macular degeneration, age related, exudative ([place]) 09/15/2016  . HCAP (healthcare-associated pneumonia) 08/07/2016  . Constipation 08/02/2016  . Renal insufficiency 04/05/2016  . Sepsis ([place]) 12/17/2015  . Pulmonary nodule, left 12/17/2015  . UTI (urinary tract infection) 12/17/2015  . Insomnia 12/17/2015  . Edema 05/02/2014  . HTN (hypertension) 05/02/2014  . AAA (abdominal aortic aneurysm) ([place]) 04/24/2014  . DDD (degenerative disc disease), lumbar 04/24/2014  . Encephalopathy, metabolic 17/91/5056  . Hypertensive urgency 04/24/2014  . Dementia without behavioral disturbance   . Bladder cancer metastasized to pelvic region [facility])   . COPD (chronic obstructive pulmonary disease) ([place])   . Hypothyroid   . Hypertension   . Nicotine addiction 11/24/2013  . Lung nodule, multiple 10/01/2013    CMP     Component Value Date/Time   NA 137 02/21/2017   K 4.7 02/21/2017   BUN 39 (A) 02/21/2017   CREATININE 1.1 02/21/2017   AST 18 09/18/2016   ALT 10 09/18/2016   ALKPHOS 77 09/18/2016    Recent Labs  09/18/16 11/13/16 02/21/17  NA 142 141 137  K 4.5 4.3 4.7  BUN 37* 49* 39*  CREATININE 1.7* 1.4* 1.1     Recent Labs  09/18/16  AST 18  ALT 10  ALKPHOS 77    Recent Labs  09/18/16 11/13/16 02/21/17  WBC 7.2 6.4 8.0  HGB 11.6* 12.5 13.1  HCT 36 39 40  PLT 138* 154 137*    Recent Labs  09/18/16  CHOL 157  LDLCALC 87  TRIG 57   No results found for: [facility] Lab Results  Component Value Date   TSH 2.15 09/18/2016   Lab Results  Component Value Date   HGBA1C 5.7 09/18/2016   Lab Results  Component Value Date   CHOL 157 09/18/2016   HDL 59 09/18/2016   LDLCALC 87 09/18/2016   TRIG 57 09/18/2016    Significant Diagnostic Results in last 30 days:  No results found.  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 [name], MD

## 2017-06-02 ENCOUNTER — Encounter: Payer: Self-pay | Admitting: Internal Medicine

## 2017-06-02 NOTE — Assessment & Plan Note (Signed)
Chronic, no major declines; continue Aricept 5 mg by mouth daily at bedtime

## 2017-06-02 NOTE — Assessment & Plan Note (Signed)
Controlled; continue Norvasc 10 mg by mouth daily

## 2017-06-02 NOTE — Assessment & Plan Note (Signed)
L eye is endstage, recieves injections in R eye; continue preservation AREDS 2 BID

## 2017-06-10 ENCOUNTER — Encounter: Payer: Self-pay | Admitting: Internal Medicine

## 2017-06-10 ENCOUNTER — Non-Acute Institutional Stay (SKILLED_NURSING_FACILITY): Payer: Medicare Other | Admitting: Internal Medicine

## 2017-06-10 DIAGNOSIS — E034 Atrophy of thyroid (acquired): Secondary | ICD-10-CM

## 2017-06-10 DIAGNOSIS — H353232 Exudative age-related macular degeneration, bilateral, with inactive choroidal neovascularization: Secondary | ICD-10-CM | POA: Diagnosis not present

## 2017-06-10 DIAGNOSIS — I739 Peripheral vascular disease, unspecified: Secondary | ICD-10-CM | POA: Diagnosis not present

## 2017-06-10 NOTE — Progress Notes (Signed)
Location:  [place] Room Number: 161W Place of Service:  SNF [phone])  Hennie Duos, MD  Patient Care Team: Hennie Duos, MD as PCP - General (Internal Medicine)  Extended Emergency Contact Information Primary Emergency Contact: [facility] Address: [address]          [place], [place] [address] Johnnette Litter of [place] Phone: [phone] Mobile Phone: [phone] Relation: Son Secondary Emergency Contact: Sharene Butters Address: [address]          [place], [place] [address] [place] of [place] Phone: [phone] Mobile Phone: [phone] Relation: Relative    Allergies: Contrast media [iodinated diagnostic agents]; Beta adrenergic blockers; Ciprofloxacin; Combigan [brimonidine tartrate-timolol]; Iodine; Moxifloxacin; Sulfa antibiotics; and Tetracyclines & related  Chief Complaint  Patient presents with  . Medical Management of Chronic Issues    routine exam     HPI: Patient is 83 y.o. female who Is being seen for routine issues of peripheral vascular disease, hypothyroidism, and macular degeneration of bilateral eyes.  Past Medical History:  Diagnosis Date  . AAA (abdominal aortic aneurysm) ([place]) 03/04/2007   small fusiform infrarenal  AAA catherization report  . Anesthesia complication 66/4403   delayed emergence  [facility]  . Arrhythmia    "occasional missed beat"  . Bladder cancer metastasized to pelvic region [facility])   . CAD (coronary artery disease) 03/04/2007   high grade stenosis ostium of the first diagonal with mild disease other vessels  . COPD (chronic obstructive pulmonary disease) ([place])   . DDD (degenerative disc disease), lumbar    back surgery  x2 last ? late 1980's  . Decubitus ulcer of coccyx    stage 1 coccyx  . Dementia without behavioral disturbance   . Glaucoma, both eyes   . History of chemotherapy   . HOH (hard of hearing)    bilateral hearing aids  . Hypertension   .  Hypothyroid 10/12/2013  . Macular degeneration of both eyes   . Neuropathy   . PAD (peripheral artery disease) ([place])   . Pulmonary nodules 10/01/2013   PET scan  . Seasonal allergies   . Stroke ([place])   . Thyroid disease 75   since age 66  . TIA (transient ischemic attack)    ? expressive aphasia with "difficulty finding words" only x 1 episode 6 months ago; person seen by son no pcp following ; no work up done  . Tobacco abuse    smoked 1/2 to 1.5 x 50 years ; currently 8 cigs / daily  . Vaginal bleeding     Past Surgical History:  Procedure Laterality Date  . ABDOMINAL HYSTERECTOMY    . APPENDECTOMY    . BACK SURGERY      x 2 ( lumbar discectomy )  . BLADDER SURGERY     01/2013 ; 07/2013 TURBT Dr. Estill Dooms  . CARDIAC CATHETERIZATION  03/04/2007  . CATARACT EXTRACTION    . COLONOSCOPY      2009 ?  Marland Kitchen CYSTOSCOPY WITH RETROGRADE PYELOGRAM, URETEROSCOPY AND STENT PLACEMENT  11/06/2013  . SKIN BIOPSY    . TONSILLECTOMY    . URETERAL STENT PLACEMENT Right    2/2 bladder CA  . VAGINAL MASS EXCISION  11/06/2013  . vaginal tumor     removed in remote past    Allergies as of 06/10/2017      Reactions   Contrast Media [iodinated Diagnostic Agents] Hives   Beta Adrenergic Blockers Other (See Comments)  Low heart rate    Ciprofloxacin    Combigan [brimonidine Tartrate-timolol]    Iodine    Moxifloxacin    Other reaction(s): Dizziness (intolerance)   Sulfa Antibiotics    Tetracyclines & Related       Medication List       Accurate as of 06/10/17 11:59 PM. Always use your most recent med list.          acetaminophen 325 MG tablet Commonly known as:  TYLENOL Take 650 mg by mouth every 6 (six) hours as needed for mild pain, moderate pain or fever.   albuterol (2.5 MG/3ML) 0.083% nebulizer solution Commonly known as:  PROVENTIL Take 2.5 mg by nebulization every 4 (four) hours as needed for shortness of breath.   amLODipine 10 MG tablet Commonly known as:   NORVASC Take 10 mg by mouth daily.   bimatoprost 0.01 % Soln Commonly known as:  LUMIGAN Place 1 drop into both eyes at bedtime.   docusate sodium 100 MG capsule Commonly known as:  COLACE Take 100 mg by mouth 2 (two) times daily.   donepezil 5 MG tablet Commonly known as:  ARICEPT Take 5 mg by mouth daily.   levothyroxine 50 MCG tablet Commonly known as:  SYNTHROID, LEVOTHROID Take 50 mcg by mouth daily before breakfast.   NUTRITIONAL SUPPLEMENT PO Offer Magic Cup to lunch/dinner tray   OXYGEN Inhale 2-3 L into the lungs continuous. To keep sats above 90%   PRESERVISION AREDS 2 Caps Take 1 capsule by mouth 2 (two) times daily.       No orders of the defined types were placed in this encounter.   Immunization History  Administered Date(s) Administered  . Influenza-Unspecified 08/25/2014, 07/11/2015  . PPD Test 12/29/2015, 01/09/2016  . Pneumococcal Conjugate-13 07/11/2015  . Pneumococcal Polysaccharide-23 08/21/2013  . Pneumococcal-Unspecified 07/06/2015  . Zoster 09/08/2013    Social History  Substance Use Topics  . Smoking status: Former Smoker    Packs/day: 0.50    Years: 60.00    Types: Cigarettes  . Smokeless tobacco: Never Used  . Alcohol use No    Review of Systems  DATA OBTAINED: from patient-Limited participation; nursing-no concerns GENERAL:  no fevers, fatigue, appetite changes SKIN: No itching, rash HEENT: No complaint RESPIRATORY: No cough, wheezing, SOB CARDIAC: No chest pain, palpitations, lower extremity edema  GI: No abdominal pain, No N/V/D or constipation, No heartburn or reflux  GU: No dysuria, frequency or urgency, or incontinence  MUSCULOSKELETAL: No unrelieved bone/joint pain NEUROLOGIC: No headache, dizziness  PSYCHIATRIC: No overt anxiety or sadness  Vitals:   06/10/17 1532  BP: 128/72  Pulse: 74  Resp: 18  Temp: 98.5 F (36.9 C)   Body mass index is 19.27 kg/m. Physical Exam  GENERAL APPEARANCE: Alert,  conversant, No acute distress  SKIN: No diaphoresis rash HEENT: Unremarkable RESPIRATORY: Breathing is even, unlabored. Lung sounds are clear   CARDIOVASCULAR: Heart RRR no murmurs, rubs or gallops. No peripheral edema  GASTROINTESTINAL: Abdomen is soft, non-tender, not distended w/ normal bowel sounds.  GENITOURINARY: Bladder non tender, not distended  MUSCULOSKELETAL: No abnormal joints or musculature NEUROLOGIC: Cranial nerves 2-12 grossly intact. Moves all extremities PSYCHIATRIC: Mood and affect with dementia, no behavioral issues  Patient Active Problem List   Diagnosis Date Noted  . Glaucoma, both eyes   . Macular degeneration of both eyes   . PAD (peripheral artery disease) ([place])   . Seasonal allergies   . HOH (hard of hearing)   . Arrhythmia   .  Chronic respiratory failure with hypoxia ([place]) 11/18/2016  . Macular degeneration, age related, exudative ([place]) 09/15/2016  . HCAP (healthcare-associated pneumonia) 08/07/2016  . Constipation 08/02/2016  . Renal insufficiency 04/05/2016  . Sepsis ([place]) 12/17/2015  . Pulmonary nodule, left 12/17/2015  . UTI (urinary tract infection) 12/17/2015  . Insomnia 12/17/2015  . Edema 05/02/2014  . HTN (hypertension) 05/02/2014  . AAA (abdominal aortic aneurysm) ([place]) 04/24/2014  . DDD (degenerative disc disease), lumbar 04/24/2014  . Encephalopathy, metabolic 62/94/7654  . Hypertensive urgency 04/24/2014  . Dementia without behavioral disturbance   . Bladder cancer metastasized to pelvic region [facility])   . COPD (chronic obstructive pulmonary disease) ([place])   . Hypothyroid   . Hypertension   . Nicotine addiction 11/24/2013  . Lung nodule, multiple 10/01/2013    CMP     Component Value Date/Time   NA 137 02/21/2017   K 4.7 02/21/2017   BUN 39 (A) 02/21/2017   CREATININE 1.1 02/21/2017   AST 18 09/18/2016   ALT 10 09/18/2016   ALKPHOS 77 09/18/2016    Recent Labs  09/18/16 11/13/16 02/21/17  NA 142 141 137  K 4.5 4.3 4.7   BUN 37* 49* 39*  CREATININE 1.7* 1.4* 1.1    Recent Labs  09/18/16  AST 18  ALT 10  ALKPHOS 77    Recent Labs  09/18/16 11/13/16 02/21/17  WBC 7.2 6.4 8.0  HGB 11.6* 12.5 13.1  HCT 36 39 40  PLT 138* 154 137*    Recent Labs  09/18/16  CHOL 157  LDLCALC 87  TRIG 57   No results found for: [facility] Lab Results  Component Value Date   TSH 2.15 09/18/2016   Lab Results  Component Value Date   HGBA1C 5.7 09/18/2016   Lab Results  Component Value Date   CHOL 157 09/18/2016   HDL 59 09/18/2016   LDLCALC 87 09/18/2016   TRIG 57 09/18/2016    Significant Diagnostic Results in last 30 days:  No results found.  Assessment and Plan  PAD (peripheral artery disease) (HCC) No reported lesions or ulcers; on no anticoagulants or antiplatelet therapies; will continue to monitor  Hypothyroid Stable with TSH of 2.15; plan to continue Synthroid 50 g daily  Macular degeneration of both eyes Exudative, secondary to age; plan to continue preservation areds 2 one by mouth twice a day; patient is followed up by Dr.    [name], MD

## 2017-06-27 ENCOUNTER — Encounter: Payer: Self-pay | Admitting: Internal Medicine

## 2017-06-27 NOTE — Assessment & Plan Note (Signed)
Stable with TSH of 2.15; plan to continue Synthroid 50 g daily

## 2017-06-27 NOTE — Assessment & Plan Note (Signed)
No reported lesions or ulcers; on no anticoagulants or antiplatelet therapies; will continue to monitor

## 2017-06-27 NOTE — Assessment & Plan Note (Signed)
Exudative, secondary to age; plan to continue preservation areds 2 one by mouth twice a day; patient is followed up by Dr.

## 2017-07-05 ENCOUNTER — Non-Acute Institutional Stay (SKILLED_NURSING_FACILITY): Payer: Medicare Other | Admitting: Internal Medicine

## 2017-07-05 DIAGNOSIS — H409 Unspecified glaucoma: Secondary | ICD-10-CM | POA: Diagnosis not present

## 2017-07-05 DIAGNOSIS — H353211 Exudative age-related macular degeneration, right eye, with active choroidal neovascularization: Secondary | ICD-10-CM

## 2017-07-05 DIAGNOSIS — G301 Alzheimer's disease with late onset: Secondary | ICD-10-CM

## 2017-07-05 DIAGNOSIS — F0280 Dementia in other diseases classified elsewhere without behavioral disturbance: Secondary | ICD-10-CM | POA: Diagnosis not present

## 2017-07-10 ENCOUNTER — Encounter: Payer: Self-pay | Admitting: Internal Medicine

## 2017-07-10 NOTE — Progress Notes (Signed)
Location:  [place] Room Number: 478G Place of Service:  SNF [phone])  Hennie Duos, MD  Patient Care Team: Hennie Duos, MD as PCP - General (Internal Medicine)  Extended Emergency Contact Information Primary Emergency Contact: [facility] Address: [address]          [place], [place] [address] Johnnette Litter of [place] Phone: [phone] Mobile Phone: [phone] Relation: Son Secondary Emergency Contact: Sharene Butters Address: [address]          [place], [place] [address] [place] of [place] Phone: [phone] Mobile Phone: [phone] Relation: Relative    Allergies: Contrast media [iodinated diagnostic agents]; Beta adrenergic blockers; Ciprofloxacin; Combigan [brimonidine tartrate-timolol]; Iodine; Moxifloxacin; Sulfa antibiotics; and Tetracyclines & related  Chief Complaint  Patient presents with  . Medical Management of Chronic Issues    routine visit    HPI: Patient is 84 y.o. female who is being seen for routine issues of glaucoma, ocular degeneration, and dementia.  Past Medical History:  Diagnosis Date  . AAA (abdominal aortic aneurysm) ([place]) 03/04/2007   small fusiform infrarenal  AAA catherization report  . Anesthesia complication 56/4332   delayed emergence  [facility]  . Arrhythmia    "occasional missed beat"  . Bladder cancer metastasized to pelvic region [facility])   . CAD (coronary artery disease) 03/04/2007   high grade stenosis ostium of the first diagonal with mild disease other vessels  . COPD (chronic obstructive pulmonary disease) ([place])   . DDD (degenerative disc disease), lumbar    back surgery  x2 last ? late 1980's  . Decubitus ulcer of coccyx    stage 1 coccyx  . Dementia without behavioral disturbance   . Glaucoma, both eyes   . History of chemotherapy   . HOH (hard of hearing)    bilateral hearing aids  . Hypertension   . Hypothyroid 10/12/2013  . Macular degeneration of  both eyes   . Neuropathy   . PAD (peripheral artery disease) ([place])   . Pulmonary nodules 10/01/2013   PET scan  . Seasonal allergies   . Stroke ([place])   . Thyroid disease 61   since age 40  . TIA (transient ischemic attack)    ? expressive aphasia with "difficulty finding words" only x 1 episode 6 months ago; person seen by son no pcp following ; no work up done  . Tobacco abuse    smoked 1/2 to 1.5 x 50 years ; currently 8 cigs / daily  . Vaginal bleeding     Past Surgical History:  Procedure Laterality Date  . ABDOMINAL HYSTERECTOMY    . APPENDECTOMY    . BACK SURGERY      x 2 ( lumbar discectomy )  . BLADDER SURGERY     01/2013 ; 07/2013 TURBT Dr. Estill Dooms  . CARDIAC CATHETERIZATION  03/04/2007  . CATARACT EXTRACTION    . COLONOSCOPY      2009 ?  Marland Kitchen CYSTOSCOPY WITH RETROGRADE PYELOGRAM, URETEROSCOPY AND STENT PLACEMENT  11/06/2013  . SKIN BIOPSY    . TONSILLECTOMY    . URETERAL STENT PLACEMENT Right    2/2 bladder CA  . VAGINAL MASS EXCISION  11/06/2013  . vaginal tumor     removed in remote past    Allergies as of 07/05/2017      Reactions   Contrast Media [iodinated Diagnostic Agents] Hives   Beta Adrenergic Blockers Other (See Comments)   Low heart rate  Ciprofloxacin    Combigan [brimonidine Tartrate-timolol]    Iodine    Moxifloxacin    Other reaction(s): Dizziness (intolerance)   Sulfa Antibiotics    Tetracyclines & Related       Medication List        Accurate as of 07/05/17 11:59 PM. Always use your most recent med list.          acetaminophen 325 MG tablet Commonly known as:  TYLENOL Take 650 mg by mouth every 6 (six) hours as needed for mild pain, moderate pain or fever.   albuterol (2.5 MG/3ML) 0.083% nebulizer solution Commonly known as:  PROVENTIL Take 2.5 mg by nebulization every 4 (four) hours as needed for shortness of breath.   amLODipine 10 MG tablet Commonly known as:  NORVASC Take 10 mg by mouth daily.   bimatoprost 0.01 %  Soln Commonly known as:  LUMIGAN Place 1 drop into both eyes at bedtime.   docusate sodium 100 MG capsule Commonly known as:  COLACE Take 100 mg by mouth 2 (two) times daily.   donepezil 5 MG tablet Commonly known as:  ARICEPT Take 5 mg by mouth daily.   levothyroxine 50 MCG tablet Commonly known as:  SYNTHROID, LEVOTHROID Take 50 mcg by mouth daily before breakfast.   NUTRITIONAL SUPPLEMENT PO Offer Magic Cup to lunch/dinner tray   OXYGEN Inhale 2-3 L into the lungs continuous. To keep sats above 90%   PRESERVISION AREDS 2 Caps Take 1 capsule by mouth 2 (two) times daily.       No orders of the defined types were placed in this encounter.   Immunization History  Administered Date(s) Administered  . Influenza-Unspecified 08/25/2014, 07/11/2015  . PPD Test 12/29/2015, 01/09/2016  . Pneumococcal Conjugate-13 07/11/2015  . Pneumococcal Polysaccharide-23 08/21/2013  . Pneumococcal-Unspecified 07/06/2015  . Tdap 06/21/2017  . Zoster 09/08/2013    Social History   Tobacco Use  . Smoking status: Former Smoker    Packs/day: 0.50    Years: 60.00    Pack years: 30.00    Types: Cigarettes  . Smokeless tobacco: Never Used  Substance Use Topics  . Alcohol use: No    Alcohol/week: 0.0 oz    Review of Systems  DATA OBTAINED: from patient-limited; nursing-no concerns GENERAL:  no fevers, fatigue, appetite changes SKIN: No itching, rash HEENT: No complaint RESPIRATORY: No cough, wheezing, SOB CARDIAC: No chest pain, palpitations, lower extremity edema  GI: No abdominal pain, No N/V/D or constipation, No heartburn or reflux  GU: No dysuria, frequency or urgency, or incontinence  MUSCULOSKELETAL: No unrelieved bone/joint pain NEUROLOGIC: No headache, dizziness  PSYCHIATRIC: No overt anxiety or sadness  Vitals:   07/05/17 1517  BP: 128/72  Pulse: 90  Resp: (!) 22  Temp: 98.5 F (36.9 C)   Body mass index is 19.8 kg/m. Physical Exam  GENERAL APPEARANCE:  Alert, conversant, No acute distress  SKIN: No diaphoresis rash HEENT: Unremarkable RESPIRATORY: Breathing is even, unlabored. Lung sounds are clear   CARDIOVASCULAR: Heart RRR no murmurs, rubs or gallops. No peripheral edema  GASTROINTESTINAL: Abdomen is soft, non-tender, not distended w/ normal bowel sounds.  GENITOURINARY: Bladder non tender, not distended  MUSCULOSKELETAL: No abnormal joints or musculature NEUROLOGIC: Cranial nerves 2-12 grossly intact. Moves all extremities PSYCHIATRIC: Mood and affect with dementia, no behavioral issues  Patient Active Problem List   Diagnosis Date Noted  . Glaucoma, both eyes   . Macular degeneration of both eyes   . PAD (peripheral artery disease) ([place])   .  Seasonal allergies   . HOH (hard of hearing)   . Arrhythmia   . Chronic respiratory failure with hypoxia ([place]) 11/18/2016  . Macular degeneration, age related, exudative ([place]) 09/15/2016  . HCAP (healthcare-associated pneumonia) 08/07/2016  . Constipation 08/02/2016  . Renal insufficiency 04/05/2016  . Sepsis ([place]) 12/17/2015  . Pulmonary nodule, left 12/17/2015  . UTI (urinary tract infection) 12/17/2015  . Insomnia 12/17/2015  . Edema 05/02/2014  . HTN (hypertension) 05/02/2014  . AAA (abdominal aortic aneurysm) ([place]) 04/24/2014  . DDD (degenerative disc disease), lumbar 04/24/2014  . Encephalopathy, metabolic 24/23/5361  . Hypertensive urgency 04/24/2014  . Dementia without behavioral disturbance   . Bladder cancer metastasized to pelvic region [facility])   . COPD (chronic obstructive pulmonary disease) ([place])   . Hypothyroid   . Hypertension   . Nicotine addiction 11/24/2013  . Lung nodule, multiple 10/01/2013    CMP     Component Value Date/Time   NA 137 02/21/2017   K 4.7 02/21/2017   BUN 39 (A) 02/21/2017   CREATININE 1.1 02/21/2017   AST 18 09/18/2016   ALT 10 09/18/2016   ALKPHOS 77 09/18/2016   Recent Labs    09/18/16 11/13/16 02/21/17  NA 142 141 137  K 4.5 4.3  4.7  BUN 37* 49* 39*  CREATININE 1.7* 1.4* 1.1   Recent Labs    09/18/16  AST 18  ALT 10  ALKPHOS 77   Recent Labs    09/18/16 11/13/16 02/21/17  WBC 7.2 6.4 8.0  HGB 11.6* 12.5 13.1  HCT 36 39 40  PLT 138* 154 137*   Recent Labs    09/18/16  CHOL 157  LDLCALC 87  TRIG 57   No results found for: [facility] Lab Results  Component Value Date   TSH 2.15 09/18/2016   Lab Results  Component Value Date   HGBA1C 5.7 09/18/2016   Lab Results  Component Value Date   CHOL 157 09/18/2016   HDL 59 09/18/2016   LDLCALC 87 09/18/2016   TRIG 57 09/18/2016    Significant Diagnostic Results in last 30 days:  No results found.  Assessment and Plan  Glaucoma, both eyes STable;continue Lumigan 0.01%1 drop each eye daily  Macular degeneration, age related, exudative ([place]) Right eye-patient receives injections;; left eye-end-stage; continue PreserVision AREDS 2  Dementia without behavioral disturbance Stable without major declines;continue Aricept 5 mg by mouth daily at bedtime    Webb Silversmith D. [name], MD

## 2017-08-06 ENCOUNTER — Non-Acute Institutional Stay (SKILLED_NURSING_FACILITY): Payer: Medicare Other | Admitting: Internal Medicine

## 2017-08-06 ENCOUNTER — Encounter: Payer: Self-pay | Admitting: Internal Medicine

## 2017-08-06 DIAGNOSIS — J9611 Chronic respiratory failure with hypoxia: Secondary | ICD-10-CM

## 2017-08-06 DIAGNOSIS — I10 Essential (primary) hypertension: Secondary | ICD-10-CM

## 2017-08-06 DIAGNOSIS — J42 Unspecified chronic bronchitis: Secondary | ICD-10-CM

## 2017-08-06 NOTE — Progress Notes (Signed)
Location:  [place] Room Number: 161W Place of Service:  SNF [phone])  Hennie Duos, MD  Patient Care Team: Hennie Duos, MD as PCP - General (Internal Medicine)  Extended Emergency Contact Information Primary Emergency Contact: [facility] Address: [address]          [place], [place] [address] [place] of [place] Phone: [phone] Mobile Phone: [phone] Relation: Son Secondary Emergency Contact: Sharene Butters Address: [address]          [place], [place] [address] [place] Phone: [phone] Mobile Phone: [phone] Relation: Relative    Allergies: Contrast media [iodinated diagnostic agents]; Beta adrenergic blockers; Ciprofloxacin; Combigan [brimonidine tartrate-timolol]; Iodine; Moxifloxacin; Sulfa antibiotics; and Tetracyclines & related  Chief Complaint  Patient presents with  . Medical Management of Chronic Issues    routine exam    HPI: Patient is 83 y.o. female who s being seen for routine issues of COPD, hypertension, and chronic respiratory failure with hypoxia.  Past Medical History:  Diagnosis Date  . AAA (abdominal aortic aneurysm) ([place]) 03/04/2007   small fusiform infrarenal  AAA catherization report  . Anesthesia complication 66/4403   delayed emergence  [facility]  . Arrhythmia    "occasional missed beat"  . Bladder cancer metastasized to pelvic region [facility])   . CAD (coronary artery disease) 03/04/2007   high grade stenosis ostium of the first diagonal with mild disease other vessels  . COPD (chronic obstructive pulmonary disease) ([place])   . DDD (degenerative disc disease), lumbar    back surgery  x2 last ? late 1980's  . Decubitus ulcer of coccyx    stage 1 coccyx  . Dementia without behavioral disturbance   . Glaucoma, both eyes   . History of chemotherapy   . HOH (hard of hearing)    bilateral hearing aids  . Hypertension   . Hypothyroid 10/12/2013  .  Macular degeneration of both eyes   . Neuropathy   . PAD (peripheral artery disease) ([place])   . Pulmonary nodules 10/01/2013   PET scan  . Seasonal allergies   . Stroke ([place])   . Thyroid disease 71   since age 73  . TIA (transient ischemic attack)    ? expressive aphasia with "difficulty finding words" only x 1 episode 6 months ago; person seen by son no pcp following ; no work up done  . Tobacco abuse    smoked 1/2 to 1.5 x 50 years ; currently 8 cigs / daily  . Vaginal bleeding     Past Surgical History:  Procedure Laterality Date  . ABDOMINAL HYSTERECTOMY    . APPENDECTOMY    . BACK SURGERY      x 2 ( lumbar discectomy )  . BLADDER SURGERY     01/2013 ; 07/2013 TURBT Dr. Estill Dooms  . CARDIAC CATHETERIZATION  03/04/2007  . CATARACT EXTRACTION    . COLONOSCOPY      2009 ?  Marland Kitchen CYSTOSCOPY WITH RETROGRADE PYELOGRAM, URETEROSCOPY AND STENT PLACEMENT  11/06/2013  . SKIN BIOPSY    . TONSILLECTOMY    . URETERAL STENT PLACEMENT Right    2/2 bladder CA  . VAGINAL MASS EXCISION  11/06/2013  . vaginal tumor     removed in remote past    Allergies as of 08/06/2017      Reactions   Contrast Media [iodinated Diagnostic Agents] Hives   Beta Adrenergic Blockers Other (See Comments)   Low  heart rate    Ciprofloxacin    Combigan [brimonidine Tartrate-timolol]    Iodine    Moxifloxacin    Other reaction(s): Dizziness (intolerance)   Sulfa Antibiotics    Tetracyclines & Related       Medication List        Accurate as of 08/06/17 11:59 PM. Always use your most recent med list.          acetaminophen 325 MG tablet Commonly known as:  TYLENOL Take 650 mg by mouth every 6 (six) hours as needed for mild pain, moderate pain or fever.   albuterol (2.5 MG/3ML) 0.083% nebulizer solution Commonly known as:  PROVENTIL Take 2.5 mg by nebulization every 4 (four) hours as needed for shortness of breath.   amLODipine 10 MG tablet Commonly known as:  NORVASC Take 10 mg by mouth  daily.   bimatoprost 0.01 % Soln Commonly known as:  LUMIGAN Place 1 drop into both eyes at bedtime.   docusate sodium 100 MG capsule Commonly known as:  COLACE Take 100 mg by mouth 2 (two) times daily.   donepezil 5 MG tablet Commonly known as:  ARICEPT Take 5 mg by mouth daily.   levothyroxine 50 MCG tablet Commonly known as:  SYNTHROID, LEVOTHROID Take 50 mcg by mouth daily before breakfast.   NUTRITIONAL SUPPLEMENT PO Offer Magic Cup to lunch/dinner tray   OXYGEN Inhale 2-3 L into the lungs continuous. To keep sats above 90%   PRESERVISION AREDS 2 Caps Take 1 capsule by mouth 2 (two) times daily.       No orders of the defined types were placed in this encounter.   Immunization History  Administered Date(s) Administered  . Influenza-Unspecified 08/25/2014, 07/11/2015  . PPD Test 12/29/2015, 01/09/2016  . Pneumococcal Conjugate-13 07/11/2015  . Pneumococcal Polysaccharide-23 08/21/2013  . Pneumococcal-Unspecified 07/06/2015  . Tdap 06/21/2017  . Zoster 09/08/2013    Social History   Tobacco Use  . Smoking status: Former Smoker    Packs/day: 0.50    Years: 60.00    Pack years: 30.00    Types: Cigarettes  . Smokeless tobacco: Never Used  Substance Use Topics  . Alcohol use: No    Alcohol/week: 0.0 oz    Review of Systems  DATA OBTAINED: from patient-limited; nursing-o new concerns GENERAL:  no fevers, fatigue, appetite changes SKIN: No itching, rash HEENT: No complaint RESPIRATORY: No cough, wheezing, SOB CARDIAC: No chest pain, palpitations, lower extremity edema  GI: No abdominal pain, No N/V/D or constipation, No heartburn or reflux  GU: No dysuria, frequency or urgency, or incontinence  MUSCULOSKELETAL: No unrelieved bone/joint pain NEUROLOGIC: No headache, dizziness  PSYCHIATRIC: No overt anxiety or sadness  Vitals:   08/06/17 1243  BP: 123/66  Pulse: 60  Resp: 18  Temp: 97.8 F (36.6 C)  SpO2: 93%   Body mass index is 19.2  kg/m. Physical Exam  GENERAL APPEARANCE: Alert, conversant, No acute distress  SKIN: No diaphoresis rash HEENT: Unremarkable RESPIRATORY: Breathing is even, unlabored. Lung sounds are decreased diffusely   CARDIOVASCULAR: Heart RRR no murmurs, rubs or gallops. No peripheral edema  GASTROINTESTINAL: Abdomen is soft, non-tender, not distended w/ normal bowel sounds.  GENITOURINARY: Bladder non tender, not distended  MUSCULOSKELETAL: No abnormal joints or musculature NEUROLOGIC: Cranial nerves 2-12 grossly intact. Moves all extremities PSYCHIATRIC: Mood and affect with dementia, no behavioral issues  Patient Active Problem List   Diagnosis Date Noted  . Glaucoma, both eyes   . Macular degeneration of both eyes   .  PAD (peripheral artery disease) ([place])   . Seasonal allergies   . HOH (hard of hearing)   . Arrhythmia   . Chronic respiratory failure with hypoxia ([place]) 11/18/2016  . Macular degeneration, age related, exudative ([place]) 09/15/2016  . HCAP (healthcare-associated pneumonia) 08/07/2016  . Constipation 08/02/2016  . Renal insufficiency 04/05/2016  . Sepsis ([place]) 12/17/2015  . Pulmonary nodule, left 12/17/2015  . UTI (urinary tract infection) 12/17/2015  . Insomnia 12/17/2015  . Edema 05/02/2014  . HTN (hypertension) 05/02/2014  . AAA (abdominal aortic aneurysm) ([place]) 04/24/2014  . DDD (degenerative disc disease), lumbar 04/24/2014  . Encephalopathy, metabolic 17/00/1749  . Hypertensive urgency 04/24/2014  . Dementia without behavioral disturbance   . Bladder cancer metastasized to pelvic region [facility])   . COPD (chronic obstructive pulmonary disease) ([place])   . Hypothyroid   . Hypertension   . Nicotine addiction 11/24/2013  . Lung nodule, multiple 10/01/2013    CMP     Component Value Date/Time   NA 137 02/21/2017   K 4.7 02/21/2017   BUN 39 (A) 02/21/2017   CREATININE 1.1 02/21/2017   AST 18 09/18/2016   ALT 10 09/18/2016   ALKPHOS 77 09/18/2016   Recent Labs     09/18/16 11/13/16 02/21/17  NA 142 141 137  K 4.5 4.3 4.7  BUN 37* 49* 39*  CREATININE 1.7* 1.4* 1.1   Recent Labs    09/18/16  AST 18  ALT 10  ALKPHOS 77   Recent Labs    09/18/16 11/13/16 02/21/17  WBC 7.2 6.4 8.0  HGB 11.6* 12.5 13.1  HCT 36 39 40  PLT 138* 154 137*   Recent Labs    09/18/16  CHOL 157  LDLCALC 87  TRIG 57   No results found for: [facility] Lab Results  Component Value Date   TSH 2.15 09/18/2016   Lab Results  Component Value Date   HGBA1C 5.7 09/18/2016   Lab Results  Component Value Date   CHOL 157 09/18/2016   HDL 59 09/18/2016   LDLCALC 87 09/18/2016   TRIG 57 09/18/2016    Significant Diagnostic Results in last 30 days:  No results found.  Assessment and Plan  COPD (chronic obstructive pulmonary disease) Stable on no mdications;continue when necessary albuterol nebs  HTN (hypertension) Controlled on Norvasc 10 mg daily; continue current regimen  Chronic respiratory failure with hypoxia (HCC) Chronic and stable; continue 2-3 L O2 nasal cannul to keep O2 saturation greater than 90%     Devan Danzer D. [name], MD

## 2017-08-25 ENCOUNTER — Encounter: Payer: Self-pay | Admitting: Internal Medicine

## 2017-08-25 NOTE — Assessment & Plan Note (Addendum)
Right eye-patient receives injections;; left eye-end-stage; continue PreserVision AREDS 2

## 2017-08-25 NOTE — Assessment & Plan Note (Signed)
Stable without major declines;continue Aricept 5 mg by mouth daily at bedtime

## 2017-08-25 NOTE — Assessment & Plan Note (Signed)
Stable on no mdications;continue when necessary albuterol nebs

## 2017-08-25 NOTE — Assessment & Plan Note (Signed)
Chronic and stable; continue 2-3 L O2 nasal cannul to keep O2 saturation greater than 90%

## 2017-08-25 NOTE — Assessment & Plan Note (Signed)
STable;continue Lumigan 0.01%1 drop each eye daily

## 2017-08-25 NOTE — Assessment & Plan Note (Signed)
Controlled on Norvasc 10 mg daily; continue current regimen

## 2017-09-11 ENCOUNTER — Encounter: Payer: Self-pay | Admitting: Internal Medicine

## 2017-09-11 ENCOUNTER — Non-Acute Institutional Stay (SKILLED_NURSING_FACILITY): Payer: Medicare Other | Admitting: Internal Medicine

## 2017-09-11 DIAGNOSIS — E034 Atrophy of thyroid (acquired): Secondary | ICD-10-CM | POA: Diagnosis not present

## 2017-09-11 DIAGNOSIS — I739 Peripheral vascular disease, unspecified: Secondary | ICD-10-CM | POA: Diagnosis not present

## 2017-09-11 DIAGNOSIS — H353232 Exudative age-related macular degeneration, bilateral, with inactive choroidal neovascularization: Secondary | ICD-10-CM | POA: Diagnosis not present

## 2017-09-11 NOTE — Progress Notes (Signed)
Location:  [place] Room Number: 329J Place of Service:  SNF [phone])  Hennie Duos, MD  Patient Care Team: Hennie Duos, MD as PCP - General (Internal Medicine)  Extended Emergency Contact Information Primary Emergency Contact: [facility] Address: [address]          [place], [place] [address] Johnnette Litter of [place] Phone: [phone] Mobile Phone: [phone] Relation: Son Secondary Emergency Contact: Sharene Butters Address: [address]          [place], [place] [address] [place] Phone: [phone] Mobile Phone: [phone] Relation: Relative    Allergies: Contrast media [iodinated diagnostic agents]; Beta adrenergic blockers; Ciprofloxacin; Combigan [brimonidine tartrate-timolol]; Iodine; Moxifloxacin; Sulfa antibiotics; and Tetracyclines & related  Chief Complaint  Patient presents with  . Medical Management of Chronic Issues    routine visit    HPI: Patient is 83 y.o. female who is being seen for routine issues of macular degeneration, hypothyroidism, and peripheral arterial disease.  Past Medical History:  Diagnosis Date  . AAA (abdominal aortic aneurysm) ([place]) 03/04/2007   small fusiform infrarenal  AAA catherization report  . Anesthesia complication 27/7412   delayed emergence  [facility]  . Arrhythmia    "occasional missed beat"  . Bladder cancer metastasized to pelvic region [facility]   . CAD (coronary artery disease) 03/04/2007   high grade stenosis ostium of the first diagonal with mild disease other vessels  . COPD (chronic obstructive pulmonary disease) ([place])   . DDD (degenerative disc disease), lumbar    back surgery  x2 last ? late 1980's  . Decubitus ulcer of coccyx    stage 1 coccyx  . Dementia without behavioral disturbance   . Glaucoma, both eyes   . History of chemotherapy   . HOH (hard of hearing)    bilateral hearing aids  . Hypertension   . Hypothyroid 10/12/2013    . Macular degeneration of both eyes   . Neuropathy   . PAD (peripheral artery disease) ([place])   . Pulmonary nodules 10/01/2013   PET scan  . Seasonal allergies   . Stroke ([place])   . Thyroid disease 61   since age 59  . TIA (transient ischemic attack)    ? expressive aphasia with "difficulty finding words" only x 1 episode 6 months ago; person seen by son no pcp following ; no work up done  . Tobacco abuse    smoked 1/2 to 1.5 x 50 years ; currently 8 cigs / daily  . Vaginal bleeding     Past Surgical History:  Procedure Laterality Date  . ABDOMINAL HYSTERECTOMY    . APPENDECTOMY    . BACK SURGERY      x 2 ( lumbar discectomy )  . BLADDER SURGERY     01/2013 ; 07/2013 TURBT Dr. Estill Dooms  . CARDIAC CATHETERIZATION  03/04/2007  . CATARACT EXTRACTION    . COLONOSCOPY      2009 ?  Marland Kitchen CYSTOSCOPY WITH RETROGRADE PYELOGRAM, URETEROSCOPY AND STENT PLACEMENT  11/06/2013  . SKIN BIOPSY    . TONSILLECTOMY    . URETERAL STENT PLACEMENT Right    2/2 bladder CA  . VAGINAL MASS EXCISION  11/06/2013  . vaginal tumor     removed in remote past    Allergies as of 09/11/2017      Reactions   Contrast Media [iodinated Diagnostic Agents] Hives   Beta Adrenergic Blockers Other (See Comments)   Low  heart rate    Ciprofloxacin    Combigan [brimonidine Tartrate-timolol]    Iodine    Moxifloxacin    Other reaction(s): Dizziness (intolerance)   Sulfa Antibiotics    Tetracyclines & Related       Medication List        Accurate as of 09/11/17 11:59 PM. Always use your most recent med list.          acetaminophen 325 MG tablet Commonly known as:  TYLENOL Take 650 mg by mouth every 6 (six) hours as needed for mild pain, moderate pain or fever.   albuterol (2.5 MG/3ML) 0.083% nebulizer solution Commonly known as:  PROVENTIL Take 2.5 mg by nebulization every 4 (four) hours as needed for shortness of breath.   amLODipine 10 MG tablet Commonly known as:  NORVASC Take 10 mg by mouth  daily.   bimatoprost 0.01 % Soln Commonly known as:  LUMIGAN Place 1 drop into both eyes at bedtime.   docusate sodium 100 MG capsule Commonly known as:  COLACE Take 100 mg by mouth 2 (two) times daily.   donepezil 5 MG tablet Commonly known as:  ARICEPT Take 5 mg by mouth daily.   levothyroxine 50 MCG tablet Commonly known as:  SYNTHROID, LEVOTHROID Take 50 mcg by mouth daily before breakfast.   NUTRITIONAL SUPPLEMENT PO Offer Magic Cup to lunch/dinner tray   OXYGEN Inhale 2-3 L into the lungs continuous. To keep sats above 90%   PRESERVISION AREDS 2 Caps Take 1 capsule by mouth 2 (two) times daily.       No orders of the defined types were placed in this encounter.   Immunization History  Administered Date(s) Administered  . Influenza-Unspecified 08/25/2014, 07/11/2015  . PPD Test 12/29/2015, 01/09/2016  . Pneumococcal Conjugate-13 07/11/2015  . Pneumococcal Polysaccharide-23 08/21/2013  . Pneumococcal-Unspecified 07/06/2015  . Tdap 06/21/2017  . Zoster 09/08/2013    Social History   Tobacco Use  . Smoking status: Former Smoker    Packs/day: 0.50    Years: 60.00    Pack years: 30.00    Types: Cigarettes  . Smokeless tobacco: Never Used  Substance Use Topics  . Alcohol use: No    Alcohol/week: 0.0 oz    Review of Systems  DATA OBTAINED: from patient-limited; nurse-no concerns GENERAL:  no fevers, fatigue, appetite changes SKIN: No itching, rash HEENT: No complaint RESPIRATORY: No cough, wheezing, SOB CARDIAC: No chest pain, palpitations, lower extremity edema  GI: No abdominal pain, No N/V/D or constipation, No heartburn or reflux  GU: No dysuria, frequency or urgency, or incontinence  MUSCULOSKELETAL: No unrelieved bone/joint pain NEUROLOGIC: No headache, dizziness  PSYCHIATRIC: No overt anxiety or sadness  Vitals:   09/11/17 1518  BP: 122/78  Pulse: 76  Resp: 16  Temp: 98.2 F (36.8 C)  SpO2: 91%   Body mass index is 19.4  kg/m. Physical Exam  GENERAL APPEARANCE: Alert, conversant, No acute distress  SKIN: No diaphoresis rash HEENT: Unremarkable RESPIRATORY: Breathing is even, unlabored. Lung sounds are clear   CARDIOVASCULAR: Heart RRR no murmurs, rubs or gallops. No peripheral edema  GASTROINTESTINAL: Abdomen is soft, non-tender, not distended w/ normal bowel sounds.  GENITOURINARY: Bladder non tender, not distended  MUSCULOSKELETAL: No abnormal joints or musculature NEUROLOGIC: Cranial nerves 2-12 grossly intact. Moves all extremities PSYCHIATRIC: Mood and affect with dementia, no behavioral issues  Patient Active Problem List   Diagnosis Date Noted  . Glaucoma, both eyes   . Macular degeneration of both eyes   .  PAD (peripheral artery disease) ([place])   . Seasonal allergies   . HOH (hard of hearing)   . Arrhythmia   . Chronic respiratory failure with hypoxia ([place]) 11/18/2016  . Macular degeneration, age related, exudative ([place]) 09/15/2016  . HCAP (healthcare-associated pneumonia) 08/07/2016  . Constipation 08/02/2016  . Renal insufficiency 04/05/2016  . Sepsis ([place]) 12/17/2015  . Pulmonary nodule, left 12/17/2015  . UTI (urinary tract infection) 12/17/2015  . Insomnia 12/17/2015  . Edema 05/02/2014  . HTN (hypertension) 05/02/2014  . AAA (abdominal aortic aneurysm) ([place]) 04/24/2014  . DDD (degenerative disc disease), lumbar 04/24/2014  . Encephalopathy, metabolic 12/75/1700  . Hypertensive urgency 04/24/2014  . Dementia without behavioral disturbance   . Bladder cancer metastasized to pelvic region [facility])   . COPD (chronic obstructive pulmonary disease) ([place])   . Hypothyroid   . Hypertension   . Nicotine addiction 11/24/2013  . Lung nodule, multiple 10/01/2013    CMP     Component Value Date/Time   NA 137 02/21/2017   K 4.7 02/21/2017   BUN 39 (A) 02/21/2017   CREATININE 1.1 02/21/2017   AST 18 09/18/2016   ALT 10 09/18/2016   ALKPHOS 77 09/18/2016   Recent Labs    09/18/16  11/13/16 02/21/17  NA 142 141 137  K 4.5 4.3 4.7  BUN 37* 49* 39*  CREATININE 1.7* 1.4* 1.1   Recent Labs    09/18/16  AST 18  ALT 10  ALKPHOS 77   Recent Labs    09/18/16 11/13/16 02/21/17  WBC 7.2 6.4 8.0  HGB 11.6* 12.5 13.1  HCT 36 39 40  PLT 138* 154 137*   Recent Labs    09/18/16  CHOL 157  LDLCALC 87  TRIG 57   No results found for: [facility] Lab Results  Component Value Date   TSH 2.15 09/18/2016   Lab Results  Component Value Date   HGBA1C 5.7 09/18/2016   Lab Results  Component Value Date   CHOL 157 09/18/2016   HDL 59 09/18/2016   LDLCALC 87 09/18/2016   TRIG 57 09/18/2016    Significant Diagnostic Results in last 30 days:  No results found.  Assessment and Plan  Macular degeneration of both eyes Secondary H, exudative; plan to continue AREDS 2 twice a day; patient is followed by ophthalmology  Hypothyroid Recent TSH 2.15; we'll plan to continue Synthroid 50 g daily while ordering a new TSH  PAD (peripheral artery disease) ([place]) No reported breakdown or ulcers; plan supportive care and monitoring    Anoop Hemmer D. [name], MD

## 2017-09-13 ENCOUNTER — Encounter: Payer: Self-pay | Admitting: Internal Medicine

## 2017-09-13 NOTE — Assessment & Plan Note (Signed)
No reported breakdown or ulcers; plan supportive care and monitoring

## 2017-09-13 NOTE — Assessment & Plan Note (Signed)
Secondary H, exudative; plan to continue AREDS 2 twice a day; patient is followed by ophthalmology

## 2017-09-13 NOTE — Assessment & Plan Note (Signed)
Recent TSH 2.15; we'll plan to continue Synthroid 50 g daily while ordering a new TSH

## 2017-09-17 LAB — CBC AND DIFFERENTIAL
HEMATOCRIT: 42 (ref 36–46)
HEMOGLOBIN: 13.3 (ref 12.0–16.0)
PLATELETS: 180 (ref 150–399)
WBC: 8.8

## 2017-09-17 LAB — BASIC METABOLIC PANEL
BUN: 29 — AB (ref 4–21)
CREATININE: 1 (ref 0.5–1.1)
GLUCOSE: 82
POTASSIUM: 4.1 (ref 3.4–5.3)
SODIUM: 142 (ref 137–147)

## 2017-09-17 LAB — LIPID PANEL
Cholesterol: 185 (ref 0–200)
HDL: 63 (ref 35–70)
LDL Cholesterol: 107
Triglycerides: 78 (ref 40–160)

## 2017-09-17 LAB — VITAMIN D 25 HYDROXY (VIT D DEFICIENCY, FRACTURES): Vit D, 25-Hydroxy: 34.93

## 2017-09-17 LAB — HEPATIC FUNCTION PANEL
ALT: 9 (ref 7–35)
AST: 18 (ref 13–35)
Alkaline Phosphatase: 90 (ref 25–125)
BILIRUBIN, TOTAL: 0.5

## 2017-09-17 LAB — HEMOGLOBIN A1C: HEMOGLOBIN A1C: 5.6

## 2017-09-17 LAB — TSH: TSH: 3.69 (ref 0.41–5.90)

## 2017-09-18 ENCOUNTER — Other Ambulatory Visit: Payer: Self-pay

## 2017-10-09 ENCOUNTER — Non-Acute Institutional Stay (SKILLED_NURSING_FACILITY): Payer: Medicare Other | Admitting: Internal Medicine

## 2017-10-09 DIAGNOSIS — J189 Pneumonia, unspecified organism: Secondary | ICD-10-CM | POA: Diagnosis not present

## 2017-10-09 DIAGNOSIS — E870 Hyperosmolality and hypernatremia: Secondary | ICD-10-CM

## 2017-10-09 DIAGNOSIS — R0902 Hypoxemia: Secondary | ICD-10-CM | POA: Diagnosis not present

## 2017-10-10 ENCOUNTER — Encounter: Payer: Self-pay | Admitting: Internal Medicine

## 2017-10-10 NOTE — Progress Notes (Signed)
Location:  [facility] Room Number: 683M Place of Service:  SNF [phone])  Hennie Duos, MD  Patient Care Team: Hennie Duos, MD as PCP - General (Internal Medicine)  Extended Emergency Contact Information Primary Emergency Contact: [facility] Address: [address]          [place], [place] [address] Johnnette Litter of [place] Phone: [phone] Mobile Phone: [phone] Relation: Son Secondary Emergency Contact: Sharene Butters Address: [place]          [place], [place] [address] [place] Phone: [phone] Mobile Phone: [phone] Relation: Relative    Allergies: Contrast media [iodinated diagnostic agents]; Beta adrenergic blockers; Ciprofloxacin; Combigan [brimonidine tartrate-timolol]; Iodine; Moxifloxacin; Sulfa antibiotics; and Tetracyclines & related  Chief Complaint  Patient presents with  . Acute Visit    Decreased mental status    HPI: Patient is 83 y.o. female who nursing asked me to see for decreased mental status for the last day or 2 with decreased by mouth intake. This is basically all of the nurses can tell me. Patient's O2 saturation at the bedside is 75% on room air, patient was noted to have a wet cough, and rhonchi in the right upper lobe. Patient was somnolent and could not give any history.  Past Medical History:  Diagnosis Date  . AAA (abdominal aortic aneurysm) ([place]) 03/04/2007   small fusiform infrarenal  AAA catherization report  . Anesthesia complication 78/6767   delayed emergence  [facility]  . Arrhythmia    "occasional missed beat"  . Bladder cancer metastasized to pelvic region [facility])   . CAD (coronary artery disease) 03/04/2007   high grade stenosis ostium of the first diagonal with mild disease other vessels  . COPD (chronic obstructive pulmonary disease) ([place])   . DDD (degenerative disc disease), lumbar    back surgery  x2 last ? late 1980's  . Decubitus ulcer of  coccyx    stage 1 coccyx  . Dementia without behavioral disturbance   . Glaucoma, both eyes   . History of chemotherapy   . HOH (hard of hearing)    bilateral hearing aids  . Hypertension   . Hypothyroid 10/12/2013  . Macular degeneration of both eyes   . Neuropathy   . PAD (peripheral artery disease) ([place])   . Pulmonary nodules 10/01/2013   PET scan  . Seasonal allergies   . Stroke ([place])   . Thyroid disease 46   since age 37  . TIA (transient ischemic attack)    ? expressive aphasia with "difficulty finding words" only x 1 episode 6 months ago; person seen by son no pcp following ; no work up done  . Tobacco abuse    smoked 1/2 to 1.5 x 50 years ; currently 8 cigs / daily  . Vaginal bleeding     Past Surgical History:  Procedure Laterality Date  . ABDOMINAL HYSTERECTOMY    . APPENDECTOMY    . BACK SURGERY      x 2 ( lumbar discectomy )  . BLADDER SURGERY     01/2013 ; 07/2013 TURBT Dr. Estill Dooms  . CARDIAC CATHETERIZATION  03/04/2007  . CATARACT EXTRACTION    . COLONOSCOPY      2009 ?  Marland Kitchen CYSTOSCOPY WITH RETROGRADE PYELOGRAM, URETEROSCOPY AND STENT PLACEMENT  11/06/2013  . SKIN BIOPSY    . TONSILLECTOMY    . URETERAL STENT PLACEMENT Right    2/2 bladder CA  . VAGINAL  MASS EXCISION  11/06/2013  . vaginal tumor     removed in remote past    Allergies as of 10/09/2017      Reactions   Contrast Media [iodinated Diagnostic Agents] Hives   Beta Adrenergic Blockers Other (See Comments)   Low heart rate    Ciprofloxacin    Combigan [brimonidine Tartrate-timolol]    Iodine    Moxifloxacin    Other reaction(s): Dizziness (intolerance)   Sulfa Antibiotics    Tetracyclines & Related       Medication List        Accurate as of 10/09/17 11:59 PM. Always use your most recent med list.          acetaminophen 325 MG tablet Commonly known as:  TYLENOL Take 650 mg by mouth every 6 (six) hours as needed for mild pain, moderate pain or fever.   albuterol (2.5 MG/3ML)  0.083% nebulizer solution Commonly known as:  PROVENTIL Take 2.5 mg by nebulization every 4 (four) hours as needed for shortness of breath.   amLODipine 10 MG tablet Commonly known as:  NORVASC Take 10 mg by mouth daily.   bimatoprost 0.01 % Soln Commonly known as:  LUMIGAN Place 1 drop into both eyes at bedtime.   docusate sodium 100 MG capsule Commonly known as:  COLACE Take 100 mg by mouth 2 (two) times daily.   donepezil 5 MG tablet Commonly known as:  ARICEPT Take 5 mg by mouth daily.   levothyroxine 50 MCG tablet Commonly known as:  SYNTHROID, LEVOTHROID Take 50 mcg by mouth daily before breakfast.   NUTRITIONAL SUPPLEMENT PO Offer Magic Cup to lunch/dinner tray   OXYGEN Inhale 2-3 L into the lungs continuous. To keep sats above 90%   PRESERVISION AREDS 2 Caps Take 1 capsule by mouth 2 (two) times daily.       No orders of the defined types were placed in this encounter.   Immunization History  Administered Date(s) Administered  . Influenza-Unspecified 08/25/2014, 07/11/2015, 09/01/2017  . PPD Test 12/29/2015, 01/09/2016  . Pneumococcal Conjugate-13 07/11/2015  . Pneumococcal Polysaccharide-23 08/21/2013  . Pneumococcal-Unspecified 07/06/2015  . Tdap 06/21/2017  . Zoster 09/08/2013    Social History   Tobacco Use  . Smoking status: Former Smoker    Packs/day: 0.50    Years: 60.00    Pack years: 30.00    Types: Cigarettes  . Smokeless tobacco: Never Used  Substance Use Topics  . Alcohol use: No    Alcohol/week: 0.0 oz    Review of  Systems-unable to obtain secondary to somnolence; nursing-as per history of present illness; also no reported fever, nausea, vomiting diarrhea    Vitals:   10/09/17 1630  BP: 123/77  Pulse: 66  Resp: 18  Temp: 98.6 F (37 C)  SpO2: (!) 89%   Body mass index is 19.3 kg/m. Physical Exam  GENERAL APPEARANCE: Somnolent, acute distress  SKIN: No diaphoresis rash HEENT: Unremarkable RESPIRATORY: Breathing  is even, unlabored. Lung sounds are with rhonchi right upper lobe; room air saturation at bedside 75%  CARDIOVASCULAR: Heart RRR no murmurs, rubs or gallops. No peripheral edema  GASTROINTESTINAL: Abdomen is soft, non-tender, not distended w/ normal bowel sounds.  GENITOURINARY: Bladder non tender, not distended  MUSCULOSKELETAL: No abnormal joints or musculature NEUROLOGIC: Cranial nerves 2-12 grossly intact. Moves all extremities PSYCHIATRIC: Somnolent, no behavioral issues  Patient Active Problem List   Diagnosis Date Noted  . Glaucoma, both eyes   . Macular degeneration of both eyes   .  PAD (peripheral artery disease) ([place])   . Seasonal allergies   . HOH (hard of hearing)   . Arrhythmia   . Chronic respiratory failure with hypoxia ([place]) 11/18/2016  . Macular degeneration, age related, exudative ([place]) 09/15/2016  . HCAP (healthcare-associated pneumonia) 08/07/2016  . Constipation 08/02/2016  . Renal insufficiency 04/05/2016  . Sepsis ([place]) 12/17/2015  . Pulmonary nodule, left 12/17/2015  . UTI (urinary tract infection) 12/17/2015  . Insomnia 12/17/2015  . Edema 05/02/2014  . HTN (hypertension) 05/02/2014  . AAA (abdominal aortic aneurysm) ([place]) 04/24/2014  . DDD (degenerative disc disease), lumbar 04/24/2014  . Encephalopathy, metabolic 50/93/2671  . Hypertensive urgency 04/24/2014  . Dementia without behavioral disturbance   . Bladder cancer metastasized to pelvic region [facility])   . COPD (chronic obstructive pulmonary disease) ([place])   . Hypothyroid   . Hypertension   . Nicotine addiction 11/24/2013  . Lung nodule, multiple 10/01/2013    CMP     Component Value Date/Time   NA 142 09/17/2017   K 4.1 09/17/2017   BUN 29 (A) 09/17/2017   CREATININE 1.0 09/17/2017   AST 18 09/17/2017   ALT 9 09/17/2017   ALKPHOS 90 09/17/2017   Recent Labs    11/13/16 02/21/17 09/17/17  NA 141 137 142  K 4.3 4.7 4.1  BUN 49* 39* 29*  CREATININE 1.4* 1.1 1.0   Recent Labs     09/17/17  AST 18  ALT 9  ALKPHOS 90   Recent Labs    11/13/16 02/21/17 09/17/17  WBC 6.4 8.0 8.8  HGB 12.5 13.1 13.3  HCT 39 40 42  PLT 154 137* 180   Recent Labs    09/17/17  CHOL 185  LDLCALC 107  TRIG 78   No results found for: [facility] Lab Results  Component Value Date   TSH 3.69 09/17/2017   Lab Results  Component Value Date   HGBA1C 5.6 09/17/2017   Lab Results  Component Value Date   CHOL 185 09/17/2017   HDL 63 09/17/2017   LDLCALC 107 09/17/2017   TRIG 78 09/17/2017    Significant Diagnostic Results in last 30 days:  No results found.  Assessment and Plan  HCAP-patient was of look like this yesterday because labs and a chest x-ray were done; chest x-ray was negative, labs not back yet; cough,desaturation,andlungexampatientmostlikelyhaspneumoniaregardlessofwhatthechestx-rayshows;patient'screatinineclearancecalculatedis30.76;patientisallergictoCipro,moxifloxacin,tetracycline,sulfacetamide;thereforewewilltreatwithOmnicef200mg every127days;nursingtellsmethepatientisabletotakehermedications; also IV fluids with normal saline started at 75 mL an hour; O2 a course was placed at 2-4 L to keep patient's O2 saturation above 90%; patient's O2 came up into that range on 2 L nasal cannula  Later entry-patient's WBC is 15.5  HYPERNATREMIA-patient's sodium returned at 150, potassium 4.3, chloride 108, CO2 27, BUN 44.8 creatinine 1.10; based on this will change over to one half normal saline at 75 mL an hour; nursing has told me the patient has trauma to 120 mL cups of fluid for her so we will give him 120 mL of fluid every 2 hours 8 times a day for 3 days for hydration     Theone Bowell D. [name], MD

## 2017-10-11 ENCOUNTER — Non-Acute Institutional Stay (SKILLED_NURSING_FACILITY): Payer: Medicare Other | Admitting: Internal Medicine

## 2017-10-11 ENCOUNTER — Encounter: Payer: Self-pay | Admitting: Internal Medicine

## 2017-10-11 DIAGNOSIS — R0902 Hypoxemia: Secondary | ICD-10-CM

## 2017-10-11 DIAGNOSIS — J189 Pneumonia, unspecified organism: Secondary | ICD-10-CM | POA: Diagnosis not present

## 2017-10-11 LAB — HEPATIC FUNCTION PANEL
ALT: 13 (ref 7–35)
AST: 25 (ref 13–35)
Alkaline Phosphatase: 112 (ref 25–125)
Bilirubin, Total: 0.5

## 2017-10-12 ENCOUNTER — Encounter: Payer: Self-pay | Admitting: Internal Medicine

## 2017-10-12 LAB — POCT INR: INR: 1 (ref 0.9–1.1)

## 2017-10-12 MED ORDER — SODIUM CHLORIDE 0.9 % IJ SOLN
10.00 | INTRAMUSCULAR | Status: DC
Start: ? — End: 2017-10-12

## 2017-10-12 MED ORDER — ACETAMINOPHEN 650 MG RE SUPP
650.00 | RECTAL | Status: DC
Start: ? — End: 2017-10-12

## 2017-10-12 MED ORDER — SODIUM CHLORIDE 0.9 % IJ SOLN
10.00 | INTRAMUSCULAR | Status: DC
Start: 2017-10-12 — End: 2017-10-12

## 2017-10-12 MED ORDER — AMLODIPINE BESYLATE 5 MG PO TABS
5.00 | ORAL_TABLET | ORAL | Status: DC
Start: 2017-10-13 — End: 2017-10-12

## 2017-10-12 MED ORDER — BENZONATATE 100 MG PO CAPS
100.00 | ORAL_CAPSULE | ORAL | Status: DC
Start: ? — End: 2017-10-12

## 2017-10-12 MED ORDER — LEVOTHYROXINE SODIUM 50 MCG PO TABS
50.00 | ORAL_TABLET | ORAL | Status: DC
Start: 2017-10-13 — End: 2017-10-12

## 2017-10-12 MED ORDER — DONEPEZIL HCL 5 MG PO TABS
5.00 | ORAL_TABLET | ORAL | Status: DC
Start: 2017-10-12 — End: 2017-10-12

## 2017-10-12 MED ORDER — BISACODYL 10 MG RE SUPP
10.00 | RECTAL | Status: DC
Start: ? — End: 2017-10-12

## 2017-10-12 MED ORDER — GENERIC EXTERNAL MEDICATION
500.00 | Status: DC
Start: 2017-10-12 — End: 2017-10-12

## 2017-10-12 MED ORDER — ALBUTEROL SULFATE (2.5 MG/3ML) 0.083% IN NEBU
2.50 | INHALATION_SOLUTION | RESPIRATORY_TRACT | Status: DC
Start: ? — End: 2017-10-12

## 2017-10-12 MED ORDER — DOCUSATE SODIUM 100 MG PO CAPS
100.00 | ORAL_CAPSULE | ORAL | Status: DC
Start: 2017-10-12 — End: 2017-10-12

## 2017-10-12 MED ORDER — ONDANSETRON HCL 4 MG/2ML IJ SOLN
4.00 | INTRAMUSCULAR | Status: DC
Start: ? — End: 2017-10-12

## 2017-10-12 MED ORDER — DEXTROSE-NACL 5-0.45 % IV SOLN
INTRAVENOUS | Status: DC
Start: ? — End: 2017-10-12

## 2017-10-12 MED ORDER — GENERIC EXTERNAL MEDICATION
2.25 g | Status: DC
Start: 2017-10-12 — End: 2017-10-12

## 2017-10-12 MED ORDER — HEPARIN SODIUM (PORCINE) 5000 UNIT/ML IJ SOLN
5000.00 | INTRAMUSCULAR | Status: DC
Start: 2017-10-12 — End: 2017-10-12

## 2017-10-12 MED ORDER — GENERIC EXTERNAL MEDICATION
1.00 | Status: DC
Start: 2017-10-12 — End: 2017-10-12

## 2017-10-12 NOTE — Progress Notes (Signed)
Location:  [facility] Room Number: 025E Place of Service:  SNF [phone])  Hennie Duos, MD  Patient Care Team: Hennie Duos, MD as PCP - General (Internal Medicine)  Extended Emergency Contact Information Primary Emergency Contact: [facility] Address: [address]          [place], [place] [address] Johnnette Litter of [place] Phone: [phone] Mobile Phone: [phone] Relation: Son Secondary Emergency Contact: Sharene Butters Address: [place]          [place], [place] [address] [place] Phone: [phone] Mobile Phone: [phone] Relation: Relative    Allergies: Contrast media [iodinated diagnostic agents]; Beta adrenergic blockers; Ciprofloxacin; Combigan [brimonidine tartrate-timolol]; Iodine; Moxifloxacin; Sulfa antibiotics; and Tetracyclines & related  Chief Complaint  Patient presents with  . Acute Visit    Pneumonia    HPI: Patient is 83 y.o. female who nursing asked me to see today because her oxygen demand has increased. The nurse when her room and she had previously been on 2 L with sats in the 90s; she was found on 2 L with sats in the low 80s. Also nursing and missed the patient is still sleepy and not eating or drinking well. Patient has been on IV fluids with half-normal saline for hyponatremia. On examination patient is still somnolent, although she wakes up to voice and answers just her no; patient's O2 saturation on 4 L was initially 85% but as I sat with her it gradually came up to 98%; patient still has rales in her right upper lobe.  Past Medical History:  Diagnosis Date  . AAA (abdominal aortic aneurysm) ([place]) 03/04/2007   small fusiform infrarenal  AAA catherization report  . Anesthesia complication 76/7341   delayed emergence  [facility]  . Arrhythmia    "occasional missed beat"  . Bladder cancer metastasized to pelvic region [facility])   . CAD (coronary artery disease) 03/04/2007   high grade stenosis ostium of the first diagonal with mild disease other vessels  . COPD (chronic obstructive pulmonary disease) ([place])   . DDD (degenerative disc disease), lumbar    back surgery  x2 last ? late 1980's  . Decubitus ulcer of coccyx    stage 1 coccyx  . Dementia without behavioral disturbance   . Glaucoma, both eyes   . History of chemotherapy   . HOH (hard of hearing)    bilateral hearing aids  . Hypertension   . Hypothyroid 10/12/2013  . Macular degeneration of both eyes   . Neuropathy   . PAD (peripheral artery disease) ([place])   . Pulmonary nodules 10/01/2013   PET scan  . Seasonal allergies   . Stroke ([place])   . Thyroid disease 24   since age 3  . TIA (transient ischemic attack)    ? expressive aphasia with "difficulty finding words" only x 1 episode 6 months ago; person seen by son no pcp following ; no work up done  . Tobacco abuse    smoked 1/2 to 1.5 x 50 years ; currently 8 cigs / daily  . Vaginal bleeding     Past Surgical History:  Procedure Laterality Date  . ABDOMINAL HYSTERECTOMY    . APPENDECTOMY    . BACK SURGERY      x 2 ( lumbar discectomy )  . BLADDER SURGERY     01/2013 ; 07/2013 TURBT Dr. Estill Dooms  . CARDIAC CATHETERIZATION  03/04/2007  . CATARACT EXTRACTION    .  COLONOSCOPY      2009 ?  Marland Kitchen CYSTOSCOPY WITH RETROGRADE PYELOGRAM, URETEROSCOPY AND STENT PLACEMENT  11/06/2013  . SKIN BIOPSY    . TONSILLECTOMY    . URETERAL STENT PLACEMENT Right    2/2 bladder CA  . VAGINAL MASS EXCISION  11/06/2013  . vaginal tumor     removed in remote past    Allergies as of 10/11/2017      Reactions   Contrast Media [iodinated Diagnostic Agents] Hives   Beta Adrenergic Blockers Other (See Comments)   Low heart rate    Ciprofloxacin    Combigan [brimonidine Tartrate-timolol]    Iodine    Moxifloxacin    Other reaction(s): Dizziness (intolerance)   Sulfa Antibiotics    Tetracyclines & Related       Medication List        Accurate as of  10/11/17 11:59 PM. Always use your most recent med list.          acetaminophen 325 MG tablet Commonly known as:  TYLENOL Take 650 mg by mouth every 6 (six) hours as needed for mild pain, moderate pain or fever.   albuterol (2.5 MG/3ML) 0.083% nebulizer solution Commonly known as:  PROVENTIL Take 2.5 mg by nebulization every 4 (four) hours as needed for shortness of breath.   amLODipine 10 MG tablet Commonly known as:  NORVASC Take 10 mg by mouth daily.   bimatoprost 0.01 % Soln Commonly known as:  LUMIGAN Place 1 drop into both eyes at bedtime.   docusate sodium 100 MG capsule Commonly known as:  COLACE Take 100 mg by mouth 2 (two) times daily.   donepezil 5 MG tablet Commonly known as:  ARICEPT Take 5 mg by mouth daily.   levothyroxine 50 MCG tablet Commonly known as:  SYNTHROID, LEVOTHROID Take 50 mcg by mouth daily before breakfast.   NUTRITIONAL SUPPLEMENT PO Offer Magic Cup to lunch/dinner tray   OXYGEN Inhale 2-3 L into the lungs continuous. To keep sats above 90%   PRESERVISION AREDS 2 Caps Take 1 capsule by mouth 2 (two) times daily.       No orders of the defined types were placed in this encounter.   Immunization History  Administered Date(s) Administered  . Influenza-Unspecified 08/25/2014, 07/11/2015, 09/01/2017  . PPD Test 12/29/2015, 01/09/2016  . Pneumococcal Conjugate-13 07/11/2015  . Pneumococcal Polysaccharide-23 08/21/2013  . Pneumococcal-Unspecified 07/06/2015  . Tdap 06/21/2017  . Zoster 09/08/2013    Social History   Tobacco Use  . Smoking status: Former Smoker    Packs/day: 0.50    Years: 60.00    Pack years: 30.00    Types: Cigarettes  . Smokeless tobacco: Never Used  Substance Use Topics  . Alcohol use: No    Alcohol/week: 0.0 oz    Review of Systems  unable to obtain secondary to somnolence; nurse-as per history of present illness    Vitals:   10/11/17 1723  BP: (!) 148/77  Pulse: 74  Resp: 20  Temp: 98.6 F  (37 C)  SpO2: 93%   Body mass index is 19.44 kg/m. Physical Exam  GENERAL APPEARANCE: Sleepy, No acute distress  SKIN: No diaphoresis rash HEENT: Unremarkable RESPIRATORY: Breathing is even, unlabored. Lung sounds are with rales/rhonchi right upper lobe; O2 saturation at bedside on 4 L nasal cannula-initially 85 gradually rising to 98%   CARDIOVASCULAR: Heart RRR no murmurs, rubs or gallops. No peripheral edema  GASTROINTESTINAL: Abdomen is soft, non-tender, not distended w/ normal bowel sounds.  GENITOURINARY:  Bladder non tender, not distended  MUSCULOSKELETAL: No abnormal joints or musculature NEUROLOGIC: Cranial nerves 2-12 grossly intact. Moves all extremities PSYCHIATRIC: Sleepy, no behavioral issues  Patient Active Problem List   Diagnosis Date Noted  . Glaucoma, both eyes   . Macular degeneration of both eyes   . PAD (peripheral artery disease) ([place])   . Seasonal allergies   . HOH (hard of hearing)   . Arrhythmia   . Chronic respiratory failure with hypoxia ([place]) 11/18/2016  . Macular degeneration, age related, exudative ([place]) 09/15/2016  . HCAP (healthcare-associated pneumonia) 08/07/2016  . Constipation 08/02/2016  . Renal insufficiency 04/05/2016  . Sepsis ([place]) 12/17/2015  . Pulmonary nodule, left 12/17/2015  . UTI (urinary tract infection) 12/17/2015  . Insomnia 12/17/2015  . Edema 05/02/2014  . HTN (hypertension) 05/02/2014  . AAA (abdominal aortic aneurysm) ([place]) 04/24/2014  . DDD (degenerative disc disease), lumbar 04/24/2014  . Encephalopathy, metabolic 88/82/8003  . Hypertensive urgency 04/24/2014  . Dementia without behavioral disturbance   . Bladder cancer metastasized to pelvic region [facility])   . COPD (chronic obstructive pulmonary disease) ([place])   . Hypothyroid   . Hypertension   . Nicotine addiction 11/24/2013  . Lung nodule, multiple 10/01/2013    CMP     Component Value Date/Time   NA 142 09/17/2017   K 4.1 09/17/2017   BUN 29 (A)  09/17/2017   CREATININE 1.0 09/17/2017   AST 18 09/17/2017   ALT 9 09/17/2017   ALKPHOS 90 09/17/2017   Recent Labs    11/13/16 02/21/17 09/17/17  NA 141 137 142  K 4.3 4.7 4.1  BUN 49* 39* 29*  CREATININE 1.4* 1.1 1.0   Recent Labs    09/17/17  AST 18  ALT 9  ALKPHOS 90   Recent Labs    11/13/16 02/21/17 09/17/17  WBC 6.4 8.0 8.8  HGB 12.5 13.1 13.3  HCT 39 40 42  PLT 154 137* 180   Recent Labs    09/17/17  CHOL 185  LDLCALC 107  TRIG 78   No results found for: [facility] Lab Results  Component Value Date   TSH 3.69 09/17/2017   Lab Results  Component Value Date   HGBA1C 5.6 09/17/2017   Lab Results  Component Value Date   CHOL 185 09/17/2017   HDL 63 09/17/2017   LDLCALC 107 09/17/2017   TRIG 78 09/17/2017    Significant Diagnostic Results in last 30 days:  No results found.  Assessment and Plan  HCAP/ HYPOXIA - per nursing patient has been able to take her antibiotics; she is also got an IV fluids; patient should be getting better after 2-1/2 days; we'll plan to send patient to hospital    Inocencio Homes, MD

## 2017-10-17 LAB — CBC AND DIFFERENTIAL
HCT: 34 — AB (ref 36–46)
HEMOGLOBIN: 10.8 — AB (ref 12.0–16.0)
Platelets: 221 (ref 150–399)
WBC: 10.5

## 2017-10-18 LAB — BASIC METABOLIC PANEL
BUN: 19 (ref 4–21)
Creatinine: 1 (ref 0.5–1.1)
Potassium: 3.8 (ref 3.4–5.3)
SODIUM: 147 (ref 137–147)

## 2017-10-21 ENCOUNTER — Encounter: Payer: Self-pay | Admitting: Internal Medicine

## 2017-10-21 ENCOUNTER — Non-Acute Institutional Stay (SKILLED_NURSING_FACILITY): Payer: Medicare Other | Admitting: Internal Medicine

## 2017-10-21 DIAGNOSIS — R4702 Dysphasia: Secondary | ICD-10-CM

## 2017-10-21 DIAGNOSIS — I10 Essential (primary) hypertension: Secondary | ICD-10-CM

## 2017-10-21 DIAGNOSIS — J189 Pneumonia, unspecified organism: Secondary | ICD-10-CM | POA: Diagnosis not present

## 2017-10-21 DIAGNOSIS — E034 Atrophy of thyroid (acquired): Secondary | ICD-10-CM

## 2017-10-21 DIAGNOSIS — F0280 Dementia in other diseases classified elsewhere without behavioral disturbance: Secondary | ICD-10-CM

## 2017-10-21 DIAGNOSIS — G301 Alzheimer's disease with late onset: Secondary | ICD-10-CM

## 2017-10-21 NOTE — Progress Notes (Signed)
: Provider:  [name], MD Location:  [place] Room Number: 270W Place of Service:  SNF [phone])  PCP: Hennie Duos, MD Patient Care Team: Hennie Duos, MD as PCP - General (Internal Medicine)  Extended Emergency Contact Information Primary Emergency Contact: [facility] Address: [address]          [place], [place] [address] Johnnette Litter of [place] Phone: [phone] Mobile Phone: [phone] Relation: Son Secondary Emergency Contact: Sharene Butters Address: [address]          [place], [place] [address] [place] of [place] Phone: [phone] Mobile Phone: [phone] Relation: Relative     Allergies: Contrast media [iodinated diagnostic agents]; Beta adrenergic blockers; Ciprofloxacin; Combigan [brimonidine tartrate-timolol]; Iodine; Moxifloxacin; Sulfa antibiotics; and Tetracyclines & related  Chief Complaint  Patient presents with  . Readmit To SNF    Admit to Facility    HPI: Patient is 84 y.o. female history of advanced transitional cell bladder cancer, dementia, and hypertension who presented to [facility] ED with decreased responsiveness shortness of breath and cough. Patient continues skilled nursing facility with antibiotics and IV fluids and had not improved. Patient was admitted to [facility] from 12/28-1/4 which she was treated for her pneumonia with IV Zosyn and Zosyn and Zithromax and then transition to Augmentin. She was seen by speech therapy who put her on a pured diet with nectar thick liquids. Given her advanced dementia and metastatic transitional cell cancer to the urethra palpated care was consulted. Patient's son opted for hospice care for comfort measures. Hospice care will be initiated with the patient gets to skilled nursing facility. While at skilled nursing facility patient will be followed for hypertension treated with hydralazine and Norvasc, dementia treated  with Aricept and hypothyroidism treated with Synthroid.  Past Medical History:  Diagnosis Date  . AAA (abdominal aortic aneurysm) ([place]) 03/04/2007   small fusiform infrarenal  AAA catherization report  . Anesthesia complication 93/8101   delayed emergence  [facility]  . Arrhythmia    "occasional missed beat"  . Bladder cancer metastasized to pelvic region [facility])   . CAD (coronary artery disease) 03/04/2007   high grade stenosis ostium of the first diagonal with mild disease other vessels  . COPD (chronic obstructive pulmonary disease) ([place])   . DDD (degenerative disc disease), lumbar    back surgery  x2 last ? late 1980's  . Decubitus ulcer of coccyx    stage 1 coccyx  . Dementia without behavioral disturbance   . Glaucoma, both eyes   . History of chemotherapy   . HOH (hard of hearing)    bilateral hearing aids  . Hypertension   . Hypothyroid 10/12/2013  . Macular degeneration of both eyes   . Neuropathy   . PAD (peripheral artery disease) ([place])   . Pulmonary nodules 10/01/2013   PET scan  . Seasonal allergies   . Stroke ([place])   . Thyroid disease 23   since age 4  . TIA (transient ischemic attack)    ? expressive aphasia with "difficulty finding words" only x 1 episode 6 months ago; person seen by son no pcp following ; no work up done  . Tobacco abuse    smoked 1/2 to 1.5 x 50 years ; currently 8 cigs / daily  . Vaginal bleeding     Past Surgical History:  Procedure Laterality Date  . ABDOMINAL HYSTERECTOMY    .  APPENDECTOMY    . BACK SURGERY      x 2 ( lumbar discectomy )  . BLADDER SURGERY     01/2013 ; 07/2013 TURBT Dr. Estill Dooms  . CARDIAC CATHETERIZATION  03/04/2007  . CATARACT EXTRACTION    . COLONOSCOPY      2009 ?  Marland Kitchen CYSTOSCOPY WITH RETROGRADE PYELOGRAM, URETEROSCOPY AND STENT PLACEMENT  11/06/2013  . SKIN BIOPSY    . TONSILLECTOMY    . URETERAL STENT PLACEMENT Right    2/2 bladder CA  . VAGINAL MASS EXCISION  11/06/2013  . vaginal tumor      removed in remote past    Allergies as of 10/21/2017      Reactions   Contrast Media [iodinated Diagnostic Agents] Hives   Beta Adrenergic Blockers Other (See Comments)   Low heart rate    Ciprofloxacin    Combigan [brimonidine Tartrate-timolol]    Iodine    Moxifloxacin    Other reaction(s): Dizziness (intolerance)   Sulfa Antibiotics    Tetracyclines & Related       Medication List        Accurate as of 10/21/17  2:56 PM. Always use your most recent med list.          acetaminophen 325 MG tablet Commonly known as:  TYLENOL Take 650 mg by mouth every 6 (six) hours as needed for mild pain, moderate pain or fever.   albuterol (2.5 MG/3ML) 0.083% nebulizer solution Commonly known as:  PROVENTIL Take 2.5 mg by nebulization every 4 (four) hours as needed for shortness of breath.   amLODipine 10 MG tablet Commonly known as:  NORVASC Take 10 mg by mouth daily.   bimatoprost 0.01 % Soln Commonly known as:  LUMIGAN Place 1 drop into both eyes at bedtime.   docusate sodium 100 MG capsule Commonly known as:  COLACE Take 100 mg by mouth 2 (two) times daily.   donepezil 5 MG tablet Commonly known as:  ARICEPT Take 5 mg by mouth daily.   hydrALAZINE 25 MG tablet Commonly known as:  APRESOLINE Take 25 mg by mouth 3 (three) times daily.   levothyroxine 50 MCG tablet Commonly known as:  SYNTHROID, LEVOTHROID Take 50 mcg by mouth daily before breakfast.   NUTRITIONAL SUPPLEMENT PO Offer Magic Cup to lunch/dinner tray   OXYGEN Inhale 2-3 L into the lungs continuous. To keep sats above 90%       No orders of the defined types were placed in this encounter.   Immunization History  Administered Date(s) Administered  . Influenza-Unspecified 08/25/2014, 07/11/2015, 08/11/2017  . PPD Test 12/29/2015, 01/09/2016  . Pneumococcal Conjugate-13 07/11/2015  . Pneumococcal Polysaccharide-23 08/21/2013  . Pneumococcal-Unspecified 07/06/2015  . Tdap 06/21/2017  . Zoster  09/08/2013    Social History   Tobacco Use  . Smoking status: Former Smoker    Packs/day: 0.50    Years: 60.00    Pack years: 30.00    Types: Cigarettes  . Smokeless tobacco: Never Used  Substance Use Topics  . Alcohol use: No    Alcohol/week: 0.0 oz    Family history is   Family History  Problem Relation Age of Onset  . Hypertension Mother   . Cancer Neg Hx   . Anesthesia problems Neg Hx       Review of Systems  DATA OBTAINED: from nurse GENERAL:  no fevers, fatigue, appetite changes SKIN: No itching, or rash EYES: No eye pain, redness, discharge EARS: No earache, tinnitus, change in hearing  NOSE: No congestion, drainage or bleeding  MOUTH/THROAT: No mouth or tooth pain, No sore throat RESPIRATORY: No cough, wheezing, SOB CARDIAC: No chest pain, palpitations, lower extremity edema  GI: No abdominal pain, No N/V/D or constipation, No heartburn or reflux  GU: No dysuria, frequency or urgency, or incontinence  MUSCULOSKELETAL: No unrelieved bone/joint pain NEUROLOGIC: No headache, dizziness or focal weakness PSYCHIATRIC: No c/o anxiety or sadness   Vitals:   10/21/17 1446  BP: 134/64  Pulse: 77  Resp: 18  Temp: 99.4 F (37.4 C)  SpO2: 99%    SpO2 Readings from Last 1 Encounters:  10/21/17 99%   Body mass index is 20.73 kg/m.     Physical Exam  GENERAL APPEARANCE: Alert,  No acute distress.  SKIN: No diaphoresis rash HEAD: Normocephalic, atraumatic  EYES: Conjunctiva/lids clear. Pupils round, reactive. EOMs intact.  EARS: External exam WNL, canals clear. Hearing grossly normal.  NOSE: No deformity or discharge.  MOUTH/THROAT: Lips w/o lesions  RESPIRATORY: Breathing is even, unlabored. Lung sounds are clear   CARDIOVASCULAR: Heart RRR no murmurs, rubs or gallops. No peripheral edema.   GASTROINTESTINAL: Abdomen is soft, non-tender, not distended w/ normal bowel sounds. GENITOURINARY: Bladder non tender, not distended  MUSCULOSKELETAL: No  abnormal joints or musculature NEUROLOGIC:  Cranial nerves 2-12 grossly intact. Moves all extremities  PSYCHIATRIC: Mood and affect with dementia, no behavioral issues  Patient Active Problem List   Diagnosis Date Noted  . Glaucoma, both eyes   . Macular degeneration of both eyes   . PAD (peripheral artery disease) ([place])   . Seasonal allergies   . HOH (hard of hearing)   . Arrhythmia   . Chronic respiratory failure with hypoxia ([place]) 11/18/2016  . Macular degeneration, age related, exudative ([place]) 09/15/2016  . HCAP (healthcare-associated pneumonia) 08/07/2016  . Constipation 08/02/2016  . Renal insufficiency 04/05/2016  . Sepsis ([place]) 12/17/2015  . Pulmonary nodule, left 12/17/2015  . UTI (urinary tract infection) 12/17/2015  . Insomnia 12/17/2015  . Edema 05/02/2014  . HTN (hypertension) 05/02/2014  . AAA (abdominal aortic aneurysm) ([place]) 04/24/2014  . DDD (degenerative disc disease), lumbar 04/24/2014  . Encephalopathy, metabolic 54/62/7035  . Hypertensive urgency 04/24/2014  . Dementia without behavioral disturbance   . Bladder cancer metastasized to pelvic region [facility])   . COPD (chronic obstructive pulmonary disease) ([place])   . Hypothyroid   . Hypertension   . Nicotine addiction 11/24/2013  . Lung nodule, multiple 10/01/2013      Labs reviewed: Basic Metabolic Panel:    Component Value Date/Time   NA 147 10/18/2017   K 3.8 10/18/2017   BUN 19 10/18/2017   CREATININE 1.0 10/18/2017   AST 25 10/11/2017   ALT 13 10/11/2017   ALKPHOS 112 10/11/2017    Recent Labs    02/21/17 09/17/17 10/18/17  NA 137 142 147  K 4.7 4.1 3.8  BUN 39* 29* 19  CREATININE 1.1 1.0 1.0   Liver Function Tests: Recent Labs    09/17/17 10/11/17  AST 18 25  ALT 9 13  ALKPHOS 90 112   No results for input(s): LIPASE, AMYLASE in the last 8760 hours. No results for input(s): AMMONIA in the last 8760 hours. CBC: Recent Labs    02/21/17 09/17/17 10/17/17  WBC 8.0 8.8 10.5  HGB  13.1 13.3 10.8*  HCT 40 42 34*  PLT 137* 180 221   Lipid Recent Labs    09/17/17  CHOL 185  HDL 63  LDLCALC 107  TRIG 78  Cardiac Enzymes: No results for input(s): CKTOTAL, CKMB, CKMBINDEX, TROPONINI in the last 8760 hours. BNP: No results for input(s): BNP in the last 8760 hours. No results found for: [facility] Lab Results  Component Value Date   HGBA1C 5.6 09/17/2017   Lab Results  Component Value Date   TSH 3.69 09/17/2017   No results found for: VITAMINB12 No results found for: FOLATE No results found for: IRON, TIBC, FERRITIN  Imaging and Procedures obtained prior to SNF admission: Ct Chest Wo Contrast  Result Date: 01/09/2016 CLINICAL DATA:  Abnormal chest x-ray.  Evaluate lung nodules. History of dimension bladder cancer. EXAM: CT CHEST WITHOUT CONTRAST TECHNIQUE: Multidetector CT imaging of the chest was performed following the standard protocol without IV contrast. COMPARISON:  Chest CT dated 12/09/2015. Chest x-ray dated 12/06/2015. 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 Stable milder dilatation of the ascending thoracic aorta. Atherosclerotic changes again seen along the entire course of the thoracic aorta. Heart size is upper normal, stable. No pericardial effusion. Diffuse coronary artery calcifications again noted. Scattered small lymph nodes are seen within the mediastinum. No mass or enlarged lymph nodes within the mediastinum or perihilar regions. Lungs/Pleura: Left upper lobe pulmonary nodule is slightly increased in size, measuring 1.8 x 1.7 x 1.7 cm (craniocaudal by transverse by AP dimensions respectively), previously 1.6 x 1.5 x 1.7 cm in these dimensions. Stable calcified granuloma within the left lower lobe. 9 mm sub solid nodule at the right lung base, located within the anterior aspects of the right lower lobe (series 5, image 40), appears stable. Patchy  consolidations are now appreciated at the right lung base, atelectasis versus early pneumonia. The right pleural effusion, small to moderate in size, is stable. Upper abdomen: No acute findings. Left renal cyst, incompletely imaged. Benign cyst within the left hepatic lobe, also incompletely imaged, initially seen on abdomen MRI of 2014. Musculoskeletal: Degenerative changes of the thoracolumbar spine. No acute or suspicious osseous lesion. IMPRESSION: 1. Slight enlargement of the lobulated left upper lobe nodule, now measuring 1.8 x 1.7 x 1.7 cm. This remains highly suspicious for malignancy. 2. Stable sub solid nodule within the right lower lobe, measuring 9 mm greatest dimension. This is also suspicious for neoplastic nodule. 3. Stable right pleural effusion, small to moderate in size. 4. New patchy consolidations at the right lung base. These could represent atelectasis, pneumonia or aspiration. Additional neoplastic process is less likely. 5. 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 Electronically Signed   By: Franki Cabot M.D.   On: 01/09/2016 15:36     Not all labs, radiology exams or other studies done during hospitalization come through on my EPIC note; however they are reviewed by me.    Assessment and Plan  HCAP- patient possibly aspiration, treated with Zosyn and Zithromax and then transition to oral Augmentin and all treatment has been finished; patient was weaned off O2 SNF -admitted for residential care; perpatient is to be a hospice patient on arrival back to skilled nursing facility and hospice has been consulted  Oral pharyngeal dysphasia SNF - will continue nectar thick liquids with pured diet until dietary has a chance to see the patient; will probably go with comfort eating once hospice was involved  Hypertension-hydralazine was started in the hospital SNF - will consider hydralazine 25 mg 3 times a day and Norvasc 10 mg daily  Dementia SNF - chronic; plan to continue  Aricept 5  mg by mouth daily at bedtime  Hypothyroidism SNF -stable-plan to continue Synthroid 50 g by mouth daily   Time spent greater than 35 minutes;> 50% of time with patient was spent reviewing records, labs, tests and studies, counseling and developing plan of care  Webb Silversmith D. [name], MD

## 2017-10-25 ENCOUNTER — Encounter: Payer: Self-pay | Admitting: Internal Medicine

## 2017-10-25 DIAGNOSIS — R4702 Dysphasia: Secondary | ICD-10-CM | POA: Insufficient documentation

## 2017-10-31 ENCOUNTER — Non-Acute Institutional Stay (SKILLED_NURSING_FACILITY): Payer: Medicare Other | Admitting: Internal Medicine

## 2017-10-31 DIAGNOSIS — Z515 Encounter for palliative care: Secondary | ICD-10-CM | POA: Diagnosis not present

## 2017-10-31 DIAGNOSIS — R627 Adult failure to thrive: Secondary | ICD-10-CM

## 2017-11-01 ENCOUNTER — Encounter: Payer: Self-pay | Admitting: Internal Medicine

## 2017-11-01 NOTE — Progress Notes (Signed)
Location:  [place] of Service:  SNF (31)  Annette Duos, MD  Patient Care Team: Annette Duos, MD as PCP - General (Internal Medicine)  Extended Emergency Contact Information Primary Emergency Contact: [facility] Address: [address]          [place], [place] [address] Annette [name] of [place] Phone: [phone] Mobile Phone: [phone] Relation: Son Secondary Emergency Contact: Sharene Butters Address: [place]          [place], [place] [address] [place] Phone: [phone] Mobile Phone: [phone] Relation: Relative    Allergies: Contrast media [iodinated diagnostic agents]; Beta adrenergic blockers; Ciprofloxacin; Combigan [brimonidine tartrate-timolol]; Iodine; Moxifloxacin; Sulfa antibiotics; and Tetracyclines & related  Chief Complaint  Patient presents with  . Acute Visit    HPI: Patient is 84 y.o. female who is being seen acutely for beginning to transition into death. She has not been eating or drinking for several days, although she is done this before and bounced back.  Past Medical History:  Diagnosis Date  . AAA (abdominal aortic aneurysm) ([place]) 03/04/2007   small fusiform infrarenal  AAA catherization report  . Anesthesia complication 55/7322   delayed emergence  [facility]  . Arrhythmia    "occasional missed beat"  . Bladder cancer metastasized to pelvic region [facility])   . CAD (coronary artery disease) 03/04/2007   high grade stenosis ostium of the first diagonal with mild disease other vessels  . COPD (chronic obstructive pulmonary disease) ([place])   . DDD (degenerative disc disease), lumbar    back surgery  x2 last ? late 1980's  . Decubitus ulcer of coccyx    stage 1 coccyx  . Dementia without behavioral disturbance   . Glaucoma, both eyes   . History of chemotherapy   . HOH (hard of hearing)    bilateral hearing aids  . Hypertension   . Hypothyroid 10/12/2013  . Macular  degeneration of both eyes   . Neuropathy   . PAD (peripheral artery disease) ([place])   . Pulmonary nodules 10/01/2013   PET scan  . Seasonal allergies   . Stroke ([place])   . Thyroid disease 62   since age 89  . TIA (transient ischemic attack)    ? expressive aphasia with "difficulty finding words" only x 1 episode 6 months ago; person seen by son no pcp following ; no work up done  . Tobacco abuse    smoked 1/2 to 1.5 x 50 years ; currently 8 cigs / daily  . Vaginal bleeding     Past Surgical History:  Procedure Laterality Date  . ABDOMINAL HYSTERECTOMY    . APPENDECTOMY    . BACK SURGERY      x 2 ( lumbar discectomy )  . BLADDER SURGERY     01/2013 ; 07/2013 TURBT Dr. Estill Dooms  . CARDIAC CATHETERIZATION  03/04/2007  . CATARACT EXTRACTION    . COLONOSCOPY      2009 ?  Marland Kitchen CYSTOSCOPY WITH RETROGRADE PYELOGRAM, URETEROSCOPY AND STENT PLACEMENT  11/06/2013  . SKIN BIOPSY    . TONSILLECTOMY    . URETERAL STENT PLACEMENT Right    2/2 bladder CA  . VAGINAL MASS EXCISION  11/06/2013  . vaginal tumor     removed in remote past    Allergies as of 10/31/2017      Reactions   Contrast Media [iodinated Diagnostic Agents] Hives   Beta Adrenergic Blockers Other (See Comments)  Low heart rate    Ciprofloxacin    Combigan [brimonidine Tartrate-timolol]    Iodine    Moxifloxacin    Other reaction(s): Dizziness (intolerance)   Sulfa Antibiotics    Tetracyclines & Related       Medication List        Accurate as of 10/31/17 11:59 PM. Always use your most recent med list.          acetaminophen 325 MG tablet Commonly known as:  TYLENOL Take 650 mg by mouth every 6 (six) hours as needed for mild pain, moderate pain or fever.   albuterol (2.5 MG/3ML) 0.083% nebulizer solution Commonly known as:  PROVENTIL Take 2.5 mg by nebulization every 4 (four) hours as needed for shortness of breath.   amLODipine 10 MG tablet Commonly known as:  NORVASC Take 10 mg by mouth daily.     bimatoprost 0.01 % Soln Commonly known as:  LUMIGAN Place 1 drop into both eyes at bedtime.   docusate sodium 100 MG capsule Commonly known as:  COLACE Take 100 mg by mouth 2 (two) times daily.   donepezil 5 MG tablet Commonly known as:  ARICEPT Take 5 mg by mouth daily.   hydrALAZINE 25 MG tablet Commonly known as:  APRESOLINE Take 25 mg by mouth 3 (three) times daily.   levothyroxine 50 MCG tablet Commonly known as:  SYNTHROID, LEVOTHROID Take 50 mcg by mouth daily before breakfast.   NUTRITIONAL SUPPLEMENT PO Offer Magic Cup to lunch/dinner tray   OXYGEN Inhale 2-3 L into the lungs continuous. To keep sats above 90%       No orders of the defined types were placed in this encounter.   Immunization History  Administered Date(s) Administered  . Influenza-Unspecified 08/25/2014, 07/11/2015, 08/11/2017  . PPD Test 12/29/2015, 01/09/2016  . Pneumococcal Conjugate-13 07/11/2015  . Pneumococcal Polysaccharide-23 08/21/2013  . Pneumococcal-Unspecified 07/06/2015  . Tdap 06/21/2017  . Zoster 09/08/2013    Social History   Tobacco Use  . Smoking status: Former Smoker    Packs/day: 0.50    Years: 60.00    Pack years: 30.00    Types: Cigarettes  . Smokeless tobacco: Never Used  Substance Use Topics  . Alcohol use: No    Alcohol/week: 0.0 oz    Review of Systems  unable to obtain secondary to decreased level of consciousness; nursing-not eating or drinking for several days      Vitals:   11/01/17 1052  BP: (!) 137/59  Pulse: 65  Resp: 18  Temp: 98.9 F (37.2 C)   There is no height or weight on file to calculate BMI. Physical Exam  GENERAL APPEARANCE: Did not open eyes to voice, No acute distress  SKIN: No diaphoresis rash HEENT: Unremarkable RESPIRATORY: Breathing is even, unlabored. Lung sounds are clear   CARDIOVASCULAR: Heart RRR no murmurs, rubs or gallops. No peripheral edema  GASTROINTESTINAL: Abdomen is soft, non-tender, not distended w/  normal bowel sounds.  GENITOURINARY: Bladder non tender, not distended  MUSCULOSKELETAL: No abnormal joints or musculature NEUROLOGIC: Patient did not move except for breathing PSYCHIATRIC: N/a  Patient Active Problem List   Diagnosis Date Noted  . Dysphasia 10/25/2017  . Glaucoma, both eyes   . Macular degeneration of both eyes   . PAD (peripheral artery disease) ([place])   . Seasonal allergies   . HOH (hard of hearing)   . Arrhythmia   . Chronic respiratory failure with hypoxia ([place]) 11/18/2016  . Macular degeneration, age related, exudative ([place]) 09/15/2016  .  HCAP (healthcare-associated pneumonia) 08/07/2016  . Constipation 08/02/2016  . Renal insufficiency 04/05/2016  . Sepsis ([place]) 12/17/2015  . Pulmonary nodule, left 12/17/2015  . UTI (urinary tract infection) 12/17/2015  . Insomnia 12/17/2015  . Edema 05/02/2014  . HTN (hypertension) 05/02/2014  . AAA (abdominal aortic aneurysm) ([place]) 04/24/2014  . DDD (degenerative disc disease), lumbar 04/24/2014  . Encephalopathy, metabolic 43/56/8616  . Hypertensive urgency 04/24/2014  . Dementia without behavioral disturbance   . Bladder cancer metastasized to pelvic region [facility])   . COPD (chronic obstructive pulmonary disease) ([place])   . Hypothyroid   . Hypertension   . Nicotine addiction 11/24/2013  . Lung nodule, multiple 10/01/2013    CMP     Component Value Date/Time   NA 147 10/18/2017   K 3.8 10/18/2017   BUN 19 10/18/2017   CREATININE 1.0 10/18/2017   AST 25 10/11/2017   ALT 13 10/11/2017   ALKPHOS 112 10/11/2017   Recent Labs    02/21/17 09/17/17 10/18/17  NA 137 142 147  K 4.7 4.1 3.8  BUN 39* 29* 19  CREATININE 1.1 1.0 1.0   Recent Labs    09/17/17 10/11/17  AST 18 25  ALT 9 13  ALKPHOS 90 112   Recent Labs    02/21/17 09/17/17 10/17/17  WBC 8.0 8.8 10.5  HGB 13.1 13.3 10.8*  HCT 40 42 34*  PLT 137* 180 221   Recent Labs    09/17/17  CHOL 185  LDLCALC 107  TRIG 78   No results found for:  [facility] Lab Results  Component Value Date   TSH 3.69 09/17/2017   Lab Results  Component Value Date   HGBA1C 5.6 09/17/2017   Lab Results  Component Value Date   CHOL 185 09/17/2017   HDL 63 09/17/2017   LDLCALC 107 09/17/2017   TRIG 78 09/17/2017    Significant Diagnostic Results in last 30 days:  No results found.  Assessment and Plan  Failure to thrive/dying care-medications have been DC'd; morphine 20 mg per mL 0.25 mils (5 mg) every 4 hours when necessary written; we will monitor and continue to speak with hospice    Inocencio Homes, MD

## 2017-11-10 ENCOUNTER — Encounter: Payer: Self-pay | Admitting: Internal Medicine

## 2017-11-15 DEATH — deceased
